# Patient Record
Sex: Female | Born: 1950
Health system: Southern US, Community
[De-identification: ages and names within clinical notes are randomized; demographics above are authoritative.]

## PROBLEM LIST (undated history)

## (undated) DIAGNOSIS — I10 Essential (primary) hypertension: Secondary | ICD-10-CM

---

## 2007-09-18 ENCOUNTER — Ambulatory Visit: Payer: Self-pay | Admitting: Orthopedic Surgery

## 2007-10-07 DIAGNOSIS — Z8679 Personal history of other diseases of the circulatory system: Secondary | ICD-10-CM | POA: Insufficient documentation

## 2007-10-07 DIAGNOSIS — I10 Essential (primary) hypertension: Secondary | ICD-10-CM | POA: Insufficient documentation

## 2007-10-09 ENCOUNTER — Ambulatory Visit: Payer: Self-pay | Admitting: Orthopedic Surgery

## 2007-10-09 DIAGNOSIS — M1711 Unilateral primary osteoarthritis, right knee: Secondary | ICD-10-CM | POA: Insufficient documentation

## 2007-10-09 DIAGNOSIS — M1710 Unilateral primary osteoarthritis, unspecified knee: Secondary | ICD-10-CM

## 2012-10-14 ENCOUNTER — Ambulatory Visit: Payer: Self-pay | Admitting: Urology

## 2012-12-04 ENCOUNTER — Ambulatory Visit (INDEPENDENT_AMBULATORY_CARE_PROVIDER_SITE_OTHER): Payer: Self-pay | Admitting: Otolaryngology

## 2013-01-08 ENCOUNTER — Ambulatory Visit (INDEPENDENT_AMBULATORY_CARE_PROVIDER_SITE_OTHER): Payer: Self-pay | Admitting: Otolaryngology

## 2013-09-02 ENCOUNTER — Encounter (INDEPENDENT_AMBULATORY_CARE_PROVIDER_SITE_OTHER): Payer: Self-pay | Admitting: *Deleted

## 2013-09-02 ENCOUNTER — Encounter (INDEPENDENT_AMBULATORY_CARE_PROVIDER_SITE_OTHER): Payer: Self-pay

## 2014-09-09 ENCOUNTER — Encounter (HOSPITAL_COMMUNITY): Payer: Self-pay | Admitting: Emergency Medicine

## 2014-09-09 ENCOUNTER — Emergency Department (HOSPITAL_COMMUNITY)
Admission: EM | Admit: 2014-09-09 | Discharge: 2014-09-09 | Disposition: A | Discharge status: Home / Self Care | Payer: BC Managed Care – PPO | Attending: Emergency Medicine | Admitting: Emergency Medicine

## 2014-09-09 DIAGNOSIS — R42 Dizziness and giddiness: Secondary | ICD-10-CM | POA: Insufficient documentation

## 2014-09-09 DIAGNOSIS — Z79899 Other long term (current) drug therapy: Secondary | ICD-10-CM | POA: Diagnosis not present

## 2014-09-09 DIAGNOSIS — F17200 Nicotine dependence, unspecified, uncomplicated: Secondary | ICD-10-CM | POA: Insufficient documentation

## 2014-09-09 DIAGNOSIS — I10 Essential (primary) hypertension: Secondary | ICD-10-CM | POA: Diagnosis not present

## 2014-09-09 DIAGNOSIS — Z7982 Long term (current) use of aspirin: Secondary | ICD-10-CM | POA: Insufficient documentation

## 2014-09-09 HISTORY — DX: Essential (primary) hypertension: I10

## 2014-09-09 LAB — BASIC METABOLIC PANEL
ANION GAP: 13 (ref 5–15)
BUN: 17 mg/dL (ref 6–23)
CHLORIDE: 101 meq/L (ref 96–112)
CO2: 26 mEq/L (ref 19–32)
Calcium: 9.2 mg/dL (ref 8.4–10.5)
Creatinine, Ser: 1.09 mg/dL (ref 0.50–1.10)
GFR, EST AFRICAN AMERICAN: 62 mL/min — AB (ref >90)
GFR, EST NON AFRICAN AMERICAN: 54 mL/min — AB (ref >90)
Glucose, Bld: 169 mg/dL — ABNORMAL HIGH (ref 70–99)
POTASSIUM: 3.4 meq/L — AB (ref 3.7–5.3)
SODIUM: 140 meq/L (ref 137–147)

## 2014-09-09 LAB — CBC
HEMATOCRIT: 39.5 % (ref 36.0–46.0)
HEMOGLOBIN: 13.5 g/dL (ref 12.0–15.0)
MCH: 31.8 pg (ref 26.0–34.0)
MCHC: 34.2 g/dL (ref 30.0–36.0)
MCV: 92.9 fL (ref 78.0–100.0)
PLATELETS: 213 10*3/uL (ref 150–400)
RBC: 4.25 MIL/uL (ref 3.87–5.11)
RDW: 14 % (ref 11.5–15.5)
WBC: 7.3 10*3/uL (ref 4.0–10.5)

## 2014-09-09 NOTE — ED Provider Notes (Signed)
CSN: [id number]     Arrival date & time 09/09/14  0109 History   First MD Initiated Contact with Patient 09/09/14 0111     Chief Complaint  Patient presents with  . Dizziness     HPI Patient presents the emergency department stating that when she awoke she felt strange and somewhat dizzy.  She feels better at this time.  She reports it is more of a "fogginess" that has come over her head.  She feels somewhat lightheaded as well.  She reports lightheadedness has resolved.  She reports that most of the "fogginess" has also resolved.  She denies weakness of her arms or legs.  She reports no recent nausea vomiting or diarrhea.  She reports her appetite has otherwise been good.  She states that her routine was somewhat abnormal today given house gas otherwise no significant changes.  Chills.  No urinary complaints.  Denies chest pain or neck pain.  Her abdominal pain.  No shortness of breath.  Symptoms are mild in severity quickly resolving on their own.  She's never had symptoms like this before.  Nothing worsens or improves her symptoms.    Past Medical History  Diagnosis Date  . Hypertension    History reviewed. No pertinent past surgical history. History reviewed. No pertinent family history. History  Substance Use Topics  . Smoking status: Current Every Day Smoker    Types: Cigarettes  . Smokeless tobacco: Not on file  . Alcohol Use: No   OB History   Grav Para Term Preterm Abortions TAB SAB Ect Mult Living                 Review of Systems  All other systems reviewed and are negative.     Allergies  Review of patient's allergies indicates not on file.  Home Medications   Prior to Admission medications   Medication Sig Start Date End Date Taking? Authorizing Provider  amLODipine (NORVASC) 5 MG tablet Take 5 mg by mouth daily.   Yes Historical Provider, MD  aspirin 81 MG tablet Take 81 mg by mouth daily.   Yes Historical Provider, MD  hydrochlorothiazide (HYDRODIURIL) 25  MG tablet Take 25 mg by mouth daily.   Yes Historical Provider, MD  metoprolol tartrate (LOPRESSOR) 25 MG tablet Take 25 mg by mouth 2 (two) times daily. Take 0.5 tablet daily   Yes Historical Provider, MD   BP 137/77  Pulse 78  Temp(Src) 98.9 F (37.2 C) (Oral)  Resp 20  Ht 5' (1.524 m)  SpO2 98% Physical Exam  Nursing note and vitals reviewed. Constitutional: She is oriented to person, place, and time. She appears well-developed and well-nourished. No distress.  HENT:  Head: Normocephalic and atraumatic.  Eyes: EOM are normal. Pupils are equal, round, and reactive to light.  Neck: Normal range of motion.  Cardiovascular: Normal rate, regular rhythm and normal heart sounds.   Pulmonary/Chest: Effort normal and breath sounds normal.  Abdominal: Soft. She exhibits no distension. There is no tenderness.  Musculoskeletal: Normal range of motion.  Neurological: She is alert and oriented to person, place, and time.  5/5 strength in major muscle groups of  bilateral upper and lower extremities. Speech normal. No facial asymetry.   Skin: Skin is warm and dry.  Psychiatric: She has a normal mood and affect. Judgment normal.    ED Course  Procedures (including critical care time) Labs Review Labs Reviewed  BASIC METABOLIC PANEL - Abnormal; Notable for the following:  Potassium 3.4 (*)    Glucose, Bld 169 (*)    GFR calc non Af Amer 54 (*)    GFR calc Af Amer 62 (*)    All other components within normal limits  CBC    Imaging Review No results found.   EKG Interpretation None      MDM   Final diagnoses:  Dizziness    Overall well-appearing.  The patient has been ambulatory.  Discharge home in good condition.  Nonfocal neuro exam.  No indication for imaging.  Unclear etiology of her symptoms but they have quickly resolved on their own.  She states she feels back to normal at this time.  Doubt stroke/TIA.  Doubt primary cardiac event.  No syncope.    Lyanne Co,  MD 09/09/14 [phone]

## 2014-09-09 NOTE — ED Notes (Signed)
Pt c/o feeling strange and dizzy when ems arrived. Pt states she feels better. Pt states she has seen police on her street and new neighbors that she don't know is causing stress.

## 2019-05-21 ENCOUNTER — Ambulatory Visit: Payer: Medicare Other | Admitting: Orthopaedic Surgery

## 2020-05-23 DIAGNOSIS — Z6831 Body mass index (BMI) 31.0-31.9, adult: Secondary | ICD-10-CM | POA: Diagnosis not present

## 2020-05-23 DIAGNOSIS — Z1331 Encounter for screening for depression: Secondary | ICD-10-CM | POA: Diagnosis not present

## 2020-05-23 DIAGNOSIS — Z1339 Encounter for screening examination for other mental health and behavioral disorders: Secondary | ICD-10-CM | POA: Diagnosis not present

## 2020-05-23 DIAGNOSIS — E559 Vitamin D deficiency, unspecified: Secondary | ICD-10-CM | POA: Diagnosis not present

## 2020-05-23 DIAGNOSIS — R5383 Other fatigue: Secondary | ICD-10-CM | POA: Diagnosis not present

## 2020-05-23 DIAGNOSIS — E7800 Pure hypercholesterolemia, unspecified: Secondary | ICD-10-CM | POA: Diagnosis not present

## 2020-05-23 DIAGNOSIS — M109 Gout, unspecified: Secondary | ICD-10-CM | POA: Diagnosis not present

## 2020-05-23 DIAGNOSIS — Z0000 Encounter for general adult medical examination without abnormal findings: Secondary | ICD-10-CM | POA: Diagnosis not present

## 2020-05-23 DIAGNOSIS — Z299 Encounter for prophylactic measures, unspecified: Secondary | ICD-10-CM | POA: Diagnosis not present

## 2020-05-23 DIAGNOSIS — Z1211 Encounter for screening for malignant neoplasm of colon: Secondary | ICD-10-CM | POA: Diagnosis not present

## 2020-05-23 DIAGNOSIS — Z79899 Other long term (current) drug therapy: Secondary | ICD-10-CM | POA: Diagnosis not present

## 2020-05-23 DIAGNOSIS — Z7189 Other specified counseling: Secondary | ICD-10-CM | POA: Diagnosis not present

## 2020-09-07 DIAGNOSIS — I10 Essential (primary) hypertension: Secondary | ICD-10-CM | POA: Diagnosis not present

## 2020-09-07 DIAGNOSIS — E1122 Type 2 diabetes mellitus with diabetic chronic kidney disease: Secondary | ICD-10-CM | POA: Diagnosis not present

## 2020-09-07 DIAGNOSIS — E1165 Type 2 diabetes mellitus with hyperglycemia: Secondary | ICD-10-CM | POA: Diagnosis not present

## 2020-09-07 DIAGNOSIS — Z299 Encounter for prophylactic measures, unspecified: Secondary | ICD-10-CM | POA: Diagnosis not present

## 2020-09-07 DIAGNOSIS — N183 Chronic kidney disease, stage 3 unspecified: Secondary | ICD-10-CM | POA: Diagnosis not present

## 2020-09-26 DIAGNOSIS — M1990 Unspecified osteoarthritis, unspecified site: Secondary | ICD-10-CM | POA: Diagnosis not present

## 2020-09-26 DIAGNOSIS — Z7982 Long term (current) use of aspirin: Secondary | ICD-10-CM | POA: Diagnosis not present

## 2020-09-26 DIAGNOSIS — E785 Hyperlipidemia, unspecified: Secondary | ICD-10-CM | POA: Diagnosis not present

## 2020-09-26 DIAGNOSIS — I10 Essential (primary) hypertension: Secondary | ICD-10-CM | POA: Diagnosis not present

## 2020-09-26 DIAGNOSIS — M109 Gout, unspecified: Secondary | ICD-10-CM | POA: Diagnosis not present

## 2020-09-26 DIAGNOSIS — Z8249 Family history of ischemic heart disease and other diseases of the circulatory system: Secondary | ICD-10-CM | POA: Diagnosis not present

## 2020-09-26 DIAGNOSIS — Z809 Family history of malignant neoplasm, unspecified: Secondary | ICD-10-CM | POA: Diagnosis not present

## 2020-09-26 DIAGNOSIS — Z833 Family history of diabetes mellitus: Secondary | ICD-10-CM | POA: Diagnosis not present

## 2020-09-26 DIAGNOSIS — Z791 Long term (current) use of non-steroidal anti-inflammatories (NSAID): Secondary | ICD-10-CM | POA: Diagnosis not present

## 2020-09-28 DIAGNOSIS — E1165 Type 2 diabetes mellitus with hyperglycemia: Secondary | ICD-10-CM | POA: Diagnosis not present

## 2020-09-28 DIAGNOSIS — I10 Essential (primary) hypertension: Secondary | ICD-10-CM | POA: Diagnosis not present

## 2020-09-28 DIAGNOSIS — E1122 Type 2 diabetes mellitus with diabetic chronic kidney disease: Secondary | ICD-10-CM | POA: Diagnosis not present

## 2020-09-28 DIAGNOSIS — Z299 Encounter for prophylactic measures, unspecified: Secondary | ICD-10-CM | POA: Diagnosis not present

## 2020-09-28 DIAGNOSIS — N183 Chronic kidney disease, stage 3 unspecified: Secondary | ICD-10-CM | POA: Diagnosis not present

## 2020-11-09 DIAGNOSIS — E7800 Pure hypercholesterolemia, unspecified: Secondary | ICD-10-CM | POA: Diagnosis not present

## 2020-11-09 DIAGNOSIS — Z23 Encounter for immunization: Secondary | ICD-10-CM | POA: Diagnosis not present

## 2020-11-09 DIAGNOSIS — I10 Essential (primary) hypertension: Secondary | ICD-10-CM | POA: Diagnosis not present

## 2020-11-09 DIAGNOSIS — Z6831 Body mass index (BMI) 31.0-31.9, adult: Secondary | ICD-10-CM | POA: Diagnosis not present

## 2020-11-09 DIAGNOSIS — N183 Chronic kidney disease, stage 3 unspecified: Secondary | ICD-10-CM | POA: Diagnosis not present

## 2020-11-09 DIAGNOSIS — R21 Rash and other nonspecific skin eruption: Secondary | ICD-10-CM | POA: Diagnosis not present

## 2020-11-09 DIAGNOSIS — Z299 Encounter for prophylactic measures, unspecified: Secondary | ICD-10-CM | POA: Diagnosis not present

## 2020-12-09 DIAGNOSIS — I10 Essential (primary) hypertension: Secondary | ICD-10-CM | POA: Diagnosis not present

## 2020-12-09 DIAGNOSIS — R42 Dizziness and giddiness: Secondary | ICD-10-CM | POA: Diagnosis not present

## 2020-12-09 DIAGNOSIS — N183 Chronic kidney disease, stage 3 unspecified: Secondary | ICD-10-CM | POA: Diagnosis not present

## 2020-12-09 DIAGNOSIS — E1122 Type 2 diabetes mellitus with diabetic chronic kidney disease: Secondary | ICD-10-CM | POA: Diagnosis not present

## 2020-12-09 DIAGNOSIS — Z299 Encounter for prophylactic measures, unspecified: Secondary | ICD-10-CM | POA: Diagnosis not present

## 2020-12-09 DIAGNOSIS — E1165 Type 2 diabetes mellitus with hyperglycemia: Secondary | ICD-10-CM | POA: Diagnosis not present

## 2021-03-13 DIAGNOSIS — M109 Gout, unspecified: Secondary | ICD-10-CM | POA: Diagnosis not present

## 2021-03-13 DIAGNOSIS — E1165 Type 2 diabetes mellitus with hyperglycemia: Secondary | ICD-10-CM | POA: Diagnosis not present

## 2021-03-13 DIAGNOSIS — N183 Chronic kidney disease, stage 3 unspecified: Secondary | ICD-10-CM | POA: Diagnosis not present

## 2021-03-13 DIAGNOSIS — Z87891 Personal history of nicotine dependence: Secondary | ICD-10-CM | POA: Diagnosis not present

## 2021-03-13 DIAGNOSIS — E1122 Type 2 diabetes mellitus with diabetic chronic kidney disease: Secondary | ICD-10-CM | POA: Diagnosis not present

## 2021-03-13 DIAGNOSIS — Z299 Encounter for prophylactic measures, unspecified: Secondary | ICD-10-CM | POA: Diagnosis not present

## 2021-03-13 DIAGNOSIS — Z6832 Body mass index (BMI) 32.0-32.9, adult: Secondary | ICD-10-CM | POA: Diagnosis not present

## 2021-03-13 DIAGNOSIS — I10 Essential (primary) hypertension: Secondary | ICD-10-CM | POA: Diagnosis not present

## 2021-03-21 DIAGNOSIS — H25813 Combined forms of age-related cataract, bilateral: Secondary | ICD-10-CM | POA: Diagnosis not present

## 2021-03-21 DIAGNOSIS — H5200 Hypermetropia, unspecified eye: Secondary | ICD-10-CM | POA: Diagnosis not present

## 2021-03-21 DIAGNOSIS — E119 Type 2 diabetes mellitus without complications: Secondary | ICD-10-CM | POA: Diagnosis not present

## 2021-05-24 DIAGNOSIS — Z7189 Other specified counseling: Secondary | ICD-10-CM | POA: Diagnosis not present

## 2021-05-24 DIAGNOSIS — E669 Obesity, unspecified: Secondary | ICD-10-CM | POA: Diagnosis not present

## 2021-05-24 DIAGNOSIS — Z1339 Encounter for screening examination for other mental health and behavioral disorders: Secondary | ICD-10-CM | POA: Diagnosis not present

## 2021-05-24 DIAGNOSIS — E1165 Type 2 diabetes mellitus with hyperglycemia: Secondary | ICD-10-CM | POA: Diagnosis not present

## 2021-05-24 DIAGNOSIS — Z299 Encounter for prophylactic measures, unspecified: Secondary | ICD-10-CM | POA: Diagnosis not present

## 2021-05-24 DIAGNOSIS — E7800 Pure hypercholesterolemia, unspecified: Secondary | ICD-10-CM | POA: Diagnosis not present

## 2021-05-24 DIAGNOSIS — Z1331 Encounter for screening for depression: Secondary | ICD-10-CM | POA: Diagnosis not present

## 2021-05-24 DIAGNOSIS — Z6834 Body mass index (BMI) 34.0-34.9, adult: Secondary | ICD-10-CM | POA: Diagnosis not present

## 2021-05-24 DIAGNOSIS — Z0000 Encounter for general adult medical examination without abnormal findings: Secondary | ICD-10-CM | POA: Diagnosis not present

## 2021-05-25 DIAGNOSIS — E559 Vitamin D deficiency, unspecified: Secondary | ICD-10-CM | POA: Diagnosis not present

## 2021-05-25 DIAGNOSIS — Z79899 Other long term (current) drug therapy: Secondary | ICD-10-CM | POA: Diagnosis not present

## 2021-05-25 DIAGNOSIS — E7800 Pure hypercholesterolemia, unspecified: Secondary | ICD-10-CM | POA: Diagnosis not present

## 2021-05-25 DIAGNOSIS — M109 Gout, unspecified: Secondary | ICD-10-CM | POA: Diagnosis not present

## 2021-05-25 DIAGNOSIS — R5383 Other fatigue: Secondary | ICD-10-CM | POA: Diagnosis not present

## 2021-05-30 DIAGNOSIS — H25813 Combined forms of age-related cataract, bilateral: Secondary | ICD-10-CM | POA: Diagnosis not present

## 2021-05-30 DIAGNOSIS — H01002 Unspecified blepharitis right lower eyelid: Secondary | ICD-10-CM | POA: Diagnosis not present

## 2021-05-30 DIAGNOSIS — H01001 Unspecified blepharitis right upper eyelid: Secondary | ICD-10-CM | POA: Diagnosis not present

## 2021-05-30 DIAGNOSIS — H01004 Unspecified blepharitis left upper eyelid: Secondary | ICD-10-CM | POA: Diagnosis not present

## 2021-06-29 ENCOUNTER — Other Ambulatory Visit: Payer: Self-pay | Admitting: Internal Medicine

## 2021-06-29 DIAGNOSIS — Z139 Encounter for screening, unspecified: Secondary | ICD-10-CM

## 2021-06-30 ENCOUNTER — Other Ambulatory Visit: Payer: Self-pay

## 2021-06-30 ENCOUNTER — Ambulatory Visit
Admission: RE | Admit: 2021-06-30 | Discharge: 2021-06-30 | Disposition: A | Discharge status: Home / Self Care | Payer: Medicare PPO | Source: Ambulatory Visit | Attending: Internal Medicine | Admitting: Internal Medicine

## 2021-06-30 DIAGNOSIS — Z139 Encounter for screening, unspecified: Secondary | ICD-10-CM

## 2021-06-30 DIAGNOSIS — Z1231 Encounter for screening mammogram for malignant neoplasm of breast: Secondary | ICD-10-CM | POA: Diagnosis not present

## 2021-06-30 LAB — HM MAMMOGRAPHY

## 2021-07-10 DIAGNOSIS — E2839 Other primary ovarian failure: Secondary | ICD-10-CM | POA: Diagnosis not present

## 2021-07-10 LAB — HM DEXA SCAN

## 2021-07-17 DIAGNOSIS — H25811 Combined forms of age-related cataract, right eye: Secondary | ICD-10-CM | POA: Diagnosis not present

## 2021-07-18 ENCOUNTER — Encounter (HOSPITAL_COMMUNITY)
Admission: RE | Admit: 2021-07-18 | Discharge: 2021-07-18 | Disposition: A | Discharge status: Home / Self Care | Payer: Medicare PPO | Source: Ambulatory Visit | Attending: Ophthalmology | Admitting: Ophthalmology

## 2021-07-18 ENCOUNTER — Other Ambulatory Visit: Payer: Self-pay

## 2021-07-18 NOTE — H&P (Signed)
Surgical History & Physical  Patient Name: Carla Webb DOB: [birth year]  Surgery: Cataract extraction with intraocular lens implant phacoemulsification; Right Eye  Surgeon: Fabio Pierce MD Surgery Date:  07/24/2021 Pre-Op Date:  07/18/2021  HPI: A 31 Yr. old female patient Pt referred by Dr. Daphine Deutscher for cataract evaluation. The patient complains of difficulty when driving due to glare from headlights or sun, which began 1-2 years ago. Both eyes are affected. The condition's severity is worsening. The condition is worse night. The complaint is associated with blurry vision, glare and halos. Pt states she has d/c night driving due to glare and poor vision in dimmer settings. Symptoms are negatively affecting pt's quality of life. Pt does not use any eye drops. Pt denies any increase in floaters/flashes of light. Pt does not check BS daily/weekly A1C%-6.2 two weeks ago HPI Completed by Dr. Fabio Pierce  Medical History: Cataracts Floaters, Asteroid Hyalosis Diabetes High Blood Pressure LDL  Review of Systems Negative Allergic/Immunologic Negative Cardiovascular Negative Constitutional Negative Ear, Nose, Mouth & Throat Negative Endocrine Negative Eyes Negative Gastrointestinal Negative Genitourinary Negative Hemotologic/Lymphatic Negative Integumentary Negative Musculoskeletal Negative Neurological Negative Psychiatry Negative Respiratory  Social   Current some day smoker of Cigarettes   Medication Allopurinol, Amlodipine besylate, Hydrochlorothiazide, Metoprolol, Aspirin, Aspirin,   Sx/Procedures  None  Drug Allergies   NKDA  History & Physical: Heent: Cataract, Right eye NECK: supple without bruits LUNGS: lungs clear to auscultation CV: regular rate and rhythm Abdomen: soft and non-tender  Impression & Plan: Assessment: 1.  COMBINED FORMS AGE RELATED CATARACT; Both Eyes (H25.813) 2.  BLEPHARITIS; Right Upper Lid, Right Lower Lid, Left Upper Lid, Left Lower  Lid (H01.001, H01.002,H01.004,H01.005) 3.  ASTEROID HYALOSIS; Both Eyes (H43.23) 4.  ASTIGMATISM, REGULAR; Both Eyes (H52.223)  Plan: 1.  Cataract accounts for the patient's decreased vision. This visual impairment is not correctable with a tolerable change in glasses or contact lenses. Cataract surgery with an implantation of a new lens should significantly improve the visual and functional status of the patient. Discussed all risks, benefits, alternatives, and potential complications. Discussed the procedures and recovery. Patient desires to have surgery. A-scan ordered and performed today for intra-ocular lens calculations. The surgery will be performed in order to improve vision for driving, reading, and for eye examinations. Recommend phacoemulsification with intra-ocular lens. Recommend Dextenza for post-operative pain and inflammation. RIght Eye Worse - First. Dilates poorly - shugacaine by protocol. Malyugin Ring. Toric Lens.  2.  Recommend regular lid cleaning.  3.  Asteroid hyalosis is a benign degenerative eye condition marked by a buildup of calcium and lipids (fats) in the fluid between your retina and lens, called the vitreous humor. It may cause some floaters.  4.  Recommend toric lens OU.

## 2021-07-24 ENCOUNTER — Encounter (HOSPITAL_COMMUNITY)
Admission: RE | Disposition: A | Discharge status: Home / Self Care | Payer: Self-pay | Source: Home / Self Care | Attending: Ophthalmology

## 2021-07-24 ENCOUNTER — Ambulatory Visit (HOSPITAL_COMMUNITY): Payer: Medicare PPO | Admitting: Anesthesiology

## 2021-07-24 ENCOUNTER — Other Ambulatory Visit: Payer: Self-pay

## 2021-07-24 ENCOUNTER — Encounter (HOSPITAL_COMMUNITY): Payer: Self-pay | Admitting: Ophthalmology

## 2021-07-24 ENCOUNTER — Ambulatory Visit (HOSPITAL_COMMUNITY)
Admission: RE | Admit: 2021-07-24 | Discharge: 2021-07-24 | Disposition: A | Discharge status: Home / Self Care | Payer: Medicare PPO | Attending: Ophthalmology | Admitting: Ophthalmology

## 2021-07-24 DIAGNOSIS — H0100A Unspecified blepharitis right eye, upper and lower eyelids: Secondary | ICD-10-CM | POA: Insufficient documentation

## 2021-07-24 DIAGNOSIS — Z79899 Other long term (current) drug therapy: Secondary | ICD-10-CM | POA: Diagnosis not present

## 2021-07-24 DIAGNOSIS — H25813 Combined forms of age-related cataract, bilateral: Secondary | ICD-10-CM | POA: Insufficient documentation

## 2021-07-24 DIAGNOSIS — E1136 Type 2 diabetes mellitus with diabetic cataract: Secondary | ICD-10-CM | POA: Diagnosis not present

## 2021-07-24 DIAGNOSIS — H2181 Floppy iris syndrome: Secondary | ICD-10-CM | POA: Diagnosis not present

## 2021-07-24 DIAGNOSIS — H4323 Crystalline deposits in vitreous body, bilateral: Secondary | ICD-10-CM | POA: Diagnosis not present

## 2021-07-24 DIAGNOSIS — H0100B Unspecified blepharitis left eye, upper and lower eyelids: Secondary | ICD-10-CM | POA: Insufficient documentation

## 2021-07-24 DIAGNOSIS — F17210 Nicotine dependence, cigarettes, uncomplicated: Secondary | ICD-10-CM | POA: Diagnosis not present

## 2021-07-24 DIAGNOSIS — H52223 Regular astigmatism, bilateral: Secondary | ICD-10-CM | POA: Diagnosis not present

## 2021-07-24 DIAGNOSIS — H25811 Combined forms of age-related cataract, right eye: Secondary | ICD-10-CM | POA: Diagnosis not present

## 2021-07-24 HISTORY — PX: CATARACT EXTRACTION W/PHACO: SHX586

## 2021-07-24 SURGERY — PHACOEMULSIFICATION, CATARACT, WITH IOL INSERTION
Anesthesia: Monitor Anesthesia Care | Laterality: Right

## 2021-07-24 MED ORDER — PHENYLEPHRINE HCL 2.5 % OP SOLN
1.0000 [drp] | OPHTHALMIC | Status: AC | PRN
  Administered 2021-07-24 (×3): 1 [drp] via OPHTHALMIC

## 2021-07-24 MED ORDER — NEOMYCIN-POLYMYXIN-DEXAMETH 3.5-10000-0.1 OP SUSP
OPHTHALMIC | Status: DC | PRN
  Administered 2021-07-24: 1 [drp] via OPHTHALMIC

## 2021-07-24 MED ORDER — MIDAZOLAM HCL 2 MG/2ML IJ SOLN
INTRAMUSCULAR | Status: DC | PRN
  Administered 2021-07-24: 1 mg via INTRAVENOUS

## 2021-07-24 MED ORDER — STERILE WATER FOR IRRIGATION IR SOLN
Status: DC | PRN
  Administered 2021-07-24: 250 mL

## 2021-07-24 MED ORDER — PHENYLEPHRINE-KETOROLAC 1-0.3 % IO SOLN
INTRAOCULAR | Status: AC
  Filled 2021-07-24: qty 4

## 2021-07-24 MED ORDER — TROPICAMIDE 1 % OP SOLN
1.0000 [drp] | OPHTHALMIC | Status: AC
  Administered 2021-07-24 (×3): 1 [drp] via OPHTHALMIC

## 2021-07-24 MED ORDER — POVIDONE-IODINE 5 % OP SOLN
OPHTHALMIC | Status: DC | PRN
  Administered 2021-07-24: 1 via OPHTHALMIC

## 2021-07-24 MED ORDER — SODIUM CHLORIDE 0.9% FLUSH
INTRAVENOUS | Status: DC | PRN
  Administered 2021-07-24: 5 mL via INTRAVENOUS

## 2021-07-24 MED ORDER — LIDOCAINE HCL (PF) 1 % IJ SOLN
INTRAOCULAR | Status: DC | PRN
  Administered 2021-07-24: 1 mL via OPHTHALMIC

## 2021-07-24 MED ORDER — SODIUM HYALURONATE 23MG/ML IO SOSY
PREFILLED_SYRINGE | INTRAOCULAR | Status: DC | PRN
  Administered 2021-07-24: 0.6 mL via INTRAOCULAR

## 2021-07-24 MED ORDER — MIDAZOLAM HCL 2 MG/2ML IJ SOLN
INTRAMUSCULAR | Status: AC
  Filled 2021-07-24: qty 2

## 2021-07-24 MED ORDER — EPINEPHRINE PF 1 MG/ML IJ SOLN
INTRAMUSCULAR | Status: AC
  Filled 2021-07-24: qty 2

## 2021-07-24 MED ORDER — BSS IO SOLN
INTRAOCULAR | Status: DC | PRN
  Administered 2021-07-24: 15 mL via INTRAOCULAR

## 2021-07-24 MED ORDER — SODIUM HYALURONATE 10 MG/ML IO SOLUTION
PREFILLED_SYRINGE | INTRAOCULAR | Status: DC | PRN
  Administered 2021-07-24: 0.85 mL via INTRAOCULAR

## 2021-07-24 MED ORDER — TETRACAINE HCL 0.5 % OP SOLN
1.0000 [drp] | OPHTHALMIC | Status: AC | PRN
  Administered 2021-07-24 (×3): 1 [drp] via OPHTHALMIC

## 2021-07-24 MED ORDER — PHENYLEPHRINE-KETOROLAC 1-0.3 % IO SOLN
INTRAOCULAR | Status: DC | PRN
  Administered 2021-07-24: 500 mL via OPHTHALMIC

## 2021-07-24 MED ORDER — LIDOCAINE HCL 3.5 % OP GEL
1.0000 "application " | Freq: Once | OPHTHALMIC | Status: AC
  Administered 2021-07-24: 1 via OPHTHALMIC

## 2021-07-24 SURGICAL SUPPLY — 13 items
CLOTH BEACON ORANGE TIMEOUT ST (SAFETY) ×2 IMPLANT
EYE SHIELD UNIVERSAL CLEAR (GAUZE/BANDAGES/DRESSINGS) ×2 IMPLANT
GLOVE SURG UNDER POLY LF SZ7 (GLOVE) ×4 IMPLANT
NEEDLE HYPO 18GX1.5 BLUNT FILL (NEEDLE) ×2 IMPLANT
PAD ARMBOARD 7.5X6 YLW CONV (MISCELLANEOUS) ×2 IMPLANT
PROC W SPEC LENS (INTRAOCULAR LENS)
PROCESS W SPEC LENS (INTRAOCULAR LENS) IMPLANT
RING MALYGIN 7.0 (MISCELLANEOUS) IMPLANT
SYR TB 1ML LL NO SAFETY (SYRINGE) ×2 IMPLANT
TAPE SURG TRANSPORE 1 IN (GAUZE/BANDAGES/DRESSINGS) ×1 IMPLANT
TAPE SURGICAL TRANSPORE 1 IN (GAUZE/BANDAGES/DRESSINGS) ×1
TECHNIS EYHANCE TORIC II IOL (Intraocular Lens) ×2 IMPLANT
WATER STERILE IRR 250ML POUR (IV SOLUTION) ×2 IMPLANT

## 2021-07-24 NOTE — Op Note (Signed)
Date of procedure: 07/24/21  Pre-operative diagnosis: Visually significant combined-form cataract, Right Eye; Poor Dilation, Right Eye (H25.811; H21.81) Visually significant regular astigmatism, right eye  Post-operative diagnosis: Visually significant cataract, Right Eye; Intra-operative Floppy Iris Syndrome, Right Eye  Procedure: Removal of cataract via phacoemulsification and insertion of intra-ocular lens Johnson and Bejou  +21.5D into the capsular bag of the Right Eye (CPT [phone])  Attending surgeon: Gerda Diss. Ivry Pigue, MD, MA  Anesthesia: MAC, Topical Akten  Complications: None  Estimated Blood Loss: <3m (minimal)  Specimens: None  Implants: As above  Indications:  Visually significant cataract, Right Eye  Procedure:  The patient was seen and identified in the pre-operative area. The operative eye was identified and dilated.  The operative eye was marked.  Topical anesthesia was administered to the operative eye.     The patient was then to the operative suite and placed in the supine position.  A timeout was performed confirming the patient, procedure to be performed, and all other relevant information.   The patient's face was prepped and draped in the usual fashion for intra-ocular surgery.  A lid speculum was placed into the operative eye and the surgical microscope moved into place and focused.  Poor dilation of the iris was confirmed.  A superotemporal paracentesis was created using a 20 gauge paracentesis blade.  Shugarcaine was injected into the anterior chamber.  Viscoelastic was injected into the anterior chamber.  A temporal clear-corneal main wound incision was created using a 2.4100mmicrokeratome.  A Malyugin ring was placed.  A continuous curvilinear capsulorrhexis was initiated using an irrigating cystitome and completed using capsulorrhexis forceps.  Hydrodissection and hydrodeliniation were performed.  Viscoelastic was injected into the anterior chamber.   A phacoemulsification handpiece and a chopper as a second instrument were used to remove the nucleus and epinucleus. The irrigation/aspiration handpiece was used to remove any remaining cortical material.   The capsular bag was reinflated with viscoelastic, checked, and found to be intact.  The eye was marked at 124 degrees.  The intraocular lens was inserted into the capsular bag and dialed into place using a MaSurveyor, minerals The Malyugin ring was removed.  The irrigation/aspiration handpiece was used to remove any remaining viscoelastic.  The clear corneal wound and paracentesis wounds were then hydrated and checked with Weck-Cels to be watertight.  The lid-speculum and drape was removed, and the patient's face was cleaned with a wet and dry 4x4.  Maxitrol was instilled in the eye before a clear shield was taped over the eye. The patient was taken to the post-operative care unit in good condition, having tolerated the procedure well.  Post-Op Instructions: [facility] and will receive all other orders and instructions.

## 2021-07-24 NOTE — Anesthesia Preprocedure Evaluation (Signed)
Anesthesia Evaluation  Patient identified by MRN, date of birth, ID band Patient awake    Reviewed: Allergy & Precautions, NPO status , Patient's Chart, lab work & pertinent test results, reviewed documented beta blocker date and time   History of Anesthesia Complications Negative for: history of anesthetic complications  Airway Mallampati: II  TM Distance: >3 FB Neck ROM: Full    Dental  (+) Dental Advisory Given, Caps   Pulmonary neg pulmonary ROS, Current Smoker and Patient abstained from smoking.,    Pulmonary exam normal breath sounds clear to auscultation       Cardiovascular Exercise Tolerance: Good hypertension, Pt. on medications and Pt. on home beta blockers Normal cardiovascular exam Rhythm:Regular Rate:Normal     Neuro/Psych negative neurological ROS  negative psych ROS   GI/Hepatic negative GI ROS, Neg liver ROS,   Endo/Other  negative endocrine ROS  Renal/GU negative Renal ROS     Musculoskeletal  (+) Arthritis ,   Abdominal   Peds  Hematology negative hematology ROS (+)   Anesthesia Other Findings   Reproductive/Obstetrics                             Anesthesia Physical Anesthesia Plan  ASA: 2  Anesthesia Plan: MAC   Post-op Pain Management:    Induction:   PONV Risk Score and Plan:   Airway Management Planned: Nasal Cannula and Natural Airway  Additional Equipment:   Intra-op Plan:   Post-operative Plan:   Informed Consent: I have reviewed the patients History and Physical, chart, labs and discussed the procedure including the risks, benefits and alternatives for the proposed anesthesia with the patient or authorized representative who has indicated his/her understanding and acceptance.     Dental advisory given  Plan Discussed with: CRNA and Surgeon  Anesthesia Plan Comments:         Anesthesia Quick Evaluation

## 2021-07-24 NOTE — Transfer of Care (Signed)
Immediate Anesthesia Transfer of Care Note  Patient: [name]  Procedure(s) Performed: CATARACT EXTRACTION PHACO AND INTRAOCULAR LENS PLACEMENT (IOC) (Right)  Patient Location: Short Stay  Anesthesia Type:MAC  Level of Consciousness: awake, alert  and oriented  Airway & Oxygen Therapy: Patient Spontanous Breathing  Post-op Assessment: Report given to RN, Post -op Vital signs reviewed and stable and Patient moving all extremities X 4  Post vital signs: Reviewed and stable  Last Vitals:  Vitals Value Taken Time  BP 123/68 07/24/21 1002  Temp 36.7 C 07/24/21 1002  Pulse 53 07/24/21 1002  Resp 16 07/24/21 1002  SpO2 100 % 07/24/21 1002    Last Pain:  Vitals:   07/24/21 1002  TempSrc: Axillary  PainSc: 0-No pain         Complications: No notable events documented.

## 2021-07-24 NOTE — Interval H&P Note (Signed)
History and Physical Interval Note:  07/24/2021 9:33 AM  [facility]  has presented today for surgery, with the diagnosis of Nuclear sclerotic cataract - Right eye.  The various methods of treatment have been discussed with the patient and family. After consideration of risks, benefits and other options for treatment, the patient has consented to  Procedure(s) with comments: CATARACT EXTRACTION PHACO AND INTRAOCULAR LENS PLACEMENT (IOC) (Right) - right as a surgical intervention.  The patient's history has been reviewed, patient examined, no change in status, stable for surgery.  I have reviewed the patient's chart and labs.  Questions were answered to the patient's satisfaction.     Fabio Pierce

## 2021-07-24 NOTE — Anesthesia Postprocedure Evaluation (Signed)
Anesthesia Post Note  Patient: [facility]  Procedure(s) Performed: CATARACT EXTRACTION PHACO AND INTRAOCULAR LENS PLACEMENT (IOC) (Right)  Patient location during evaluation: Phase II Anesthesia Type: MAC Level of consciousness: awake and alert and oriented Pain management: pain level controlled Vital Signs Assessment: post-procedure vital signs reviewed and stable Respiratory status: spontaneous breathing and respiratory function stable Cardiovascular status: blood pressure returned to baseline and stable Postop Assessment: no apparent nausea or vomiting Anesthetic complications: no   No notable events documented.   Last Vitals:  Vitals:   07/24/21 0840 07/24/21 1002  BP: (!) 144/63 123/68  Pulse: (!) 57 (!) 53  Resp: 16 16  Temp: 36.7 C 36.7 C  SpO2: 100% 100%    Last Pain:  Vitals:   07/24/21 1002  TempSrc: Axillary  PainSc: 0-No pain                 Corisa Montini C Maili Shutters

## 2021-07-24 NOTE — Anesthesia Procedure Notes (Signed)
Procedure Name: MAC Date/Time: 07/24/2021 9:40 AM Performed by: Orlie Dakin, CRNA Pre-anesthesia Checklist: Emergency Drugs available, Patient identified, Suction available and Patient being monitored Patient Re-evaluated:Patient Re-evaluated prior to induction Oxygen Delivery Method: Nasal cannula Placement Confirmation: positive ETCO2

## 2021-07-24 NOTE — Discharge Instructions (Signed)
Please discharge patient when stable, will follow up today with Dr. Cordarrius Coad at the [facility] [place] office immediately following discharge.  Leave shield in place until visit.  All paperwork with discharge instructions will be given at the office.  [facility] Address:  [address]  [place], [place] [address]  

## 2021-07-25 ENCOUNTER — Encounter (HOSPITAL_COMMUNITY): Payer: Self-pay | Admitting: Ophthalmology

## 2021-07-31 DIAGNOSIS — H25812 Combined forms of age-related cataract, left eye: Secondary | ICD-10-CM | POA: Diagnosis not present

## 2021-08-01 NOTE — H&P (Signed)
Surgical History & Physical  Patient Name: Carla Webb DOB: 13-May-1950  Surgery: Cataract extraction with intraocular lens implant phacoemulsification; Left Eye  Surgeon: Fabio Pierce MD Surgery Date:  08/07/2021 Pre-Op Date:  07/31/2021  HPI: A 68 Yr. old female patient The patient is returning after cataract surgery. The right eye is affected. Status post cataract surgery, which began 1 week ago: Since the last visit, the affected area is doing well. The patient's vision is improved and stable. Patient is following medication instructions. Pt taking combo drop TID OD. Pt denies any increase in floaters/flashes of light. The patient complains of difficulty when driving due to glare from headlights or sun, which began 1-2 years ago. The left eye is affected. The condition's severity is worsening. The condition is worse night. The complaint is associated with blurry vision, glare and halos. Pt states she has d/c night driving due to glare and poor vision in dimmer settings. Symptoms are negatively affecting pt's quality of life. HPI Completed by Dr. Fabio Pierce  Medical History: Cataracts Floaters, Asteroid Hyalosis Diabetes High Blood Pressure LDL  Review of Systems Negative Allergic/Immunologic Negative Cardiovascular Negative Constitutional Negative Ear, Nose, Mouth & Throat Negative Endocrine Negative Eyes Negative Gastrointestinal Negative Genitourinary Negative Hemotologic/Lymphatic Negative Integumentary Negative Musculoskeletal Negative Neurological Negative Psychiatry Negative Respiratory  Social   Current some day smoker of Cigarettes   Medication Prednisolone-Moxifloxacin-Bromfenac,  Allopurinol, Amlodipine besylate, Hydrochlorothiazide, Metoprolol, Aspirin, Aspirin,   Sx/Procedures Phaco c IOL OD-Toric,   Drug Allergies   NKDA  History & Physical: Heent:  Cataract, Left eye NECK: supple without bruits LUNGS: lungs clear to auscultation CV:  regular rate and rhythm Abdomen: soft and non-tender  Impression & Plan: Assessment: 1.  COMBINED FORMS AGE RELATED CATARACT; Left Eye (H25.812) 2.  CATARACT EXTRACTION STATUS; Right Eye (Z98.41) 3.  INTRAOCULAR LENS IOL (Z96.1) 4.  Presbyopia ; Both Eyes (H52.4) 5.  NUCLEAR SCLEROSIS AGE RELATED; Left Eye (H25.12) 6.  ASTIGMATISM, REGULAR; Both Eyes (H52.223)  Plan: 1.  Left Eye. Dilates poorly - shugacaine by protocol. Malyugin Ring. Omidira. Toric Lens. Cataract accounts for the patient's decreased vision. This visual impairment is not correctable with a tolerable change in glasses or contact lenses. Cataract surgery with an implantation of a new lens should significantly improve the visual and functional status of the patient. Discussed all risks, benefits, alternatives, and potential complications. Discussed the procedures and recovery. Patient desires to have surgery. A-scan ordered and performed today for intra-ocular lens calculations. The surgery will be performed in order to improve vision for driving, reading, and for eye examinations. Recommend phacoemulsification with intra-ocular lens. Recommend Dextenza for post-operative pain and inflammation. Surgery required to correct imbalance of vision.  2.  1 week after cataract surgery. Doing well with improved vision and normal eye pressure. Call with any problems or concerns. Continue Pred-Moxi-Brom 2x/day for 3 more weeks.  3.  Doing well since surgery Continue Post-op medications  4.   5.   6.  Recommend Toric IOL OU.

## 2021-08-03 ENCOUNTER — Encounter (HOSPITAL_COMMUNITY): Payer: Self-pay

## 2021-08-03 ENCOUNTER — Encounter (HOSPITAL_COMMUNITY)
Admission: RE | Admit: 2021-08-03 | Discharge: 2021-08-03 | Disposition: A | Discharge status: Home / Self Care | Payer: Medicare PPO | Source: Ambulatory Visit | Attending: Ophthalmology | Admitting: Ophthalmology

## 2021-08-03 ENCOUNTER — Other Ambulatory Visit: Payer: Self-pay

## 2021-08-07 ENCOUNTER — Other Ambulatory Visit: Payer: Self-pay

## 2021-08-07 ENCOUNTER — Encounter (HOSPITAL_COMMUNITY)
Admission: RE | Disposition: A | Discharge status: Home / Self Care | Payer: Self-pay | Source: Home / Self Care | Attending: Ophthalmology

## 2021-08-07 ENCOUNTER — Encounter (HOSPITAL_COMMUNITY): Payer: Self-pay | Admitting: Ophthalmology

## 2021-08-07 ENCOUNTER — Ambulatory Visit (HOSPITAL_COMMUNITY)
Admission: RE | Admit: 2021-08-07 | Discharge: 2021-08-07 | Disposition: A | Discharge status: Home / Self Care | Payer: Medicare PPO | Attending: Ophthalmology | Admitting: Ophthalmology

## 2021-08-07 ENCOUNTER — Ambulatory Visit (HOSPITAL_COMMUNITY): Payer: Medicare PPO | Admitting: Certified Registered"

## 2021-08-07 DIAGNOSIS — F17210 Nicotine dependence, cigarettes, uncomplicated: Secondary | ICD-10-CM | POA: Insufficient documentation

## 2021-08-07 DIAGNOSIS — H2181 Floppy iris syndrome: Secondary | ICD-10-CM | POA: Insufficient documentation

## 2021-08-07 DIAGNOSIS — Z9841 Cataract extraction status, right eye: Secondary | ICD-10-CM | POA: Insufficient documentation

## 2021-08-07 DIAGNOSIS — H52202 Unspecified astigmatism, left eye: Secondary | ICD-10-CM | POA: Diagnosis not present

## 2021-08-07 DIAGNOSIS — E1136 Type 2 diabetes mellitus with diabetic cataract: Secondary | ICD-10-CM | POA: Diagnosis not present

## 2021-08-07 DIAGNOSIS — F17200 Nicotine dependence, unspecified, uncomplicated: Secondary | ICD-10-CM | POA: Diagnosis not present

## 2021-08-07 DIAGNOSIS — Z961 Presence of intraocular lens: Secondary | ICD-10-CM | POA: Insufficient documentation

## 2021-08-07 DIAGNOSIS — H524 Presbyopia: Secondary | ICD-10-CM | POA: Insufficient documentation

## 2021-08-07 DIAGNOSIS — H25812 Combined forms of age-related cataract, left eye: Secondary | ICD-10-CM | POA: Diagnosis not present

## 2021-08-07 HISTORY — PX: CATARACT EXTRACTION W/PHACO: SHX586

## 2021-08-07 SURGERY — PHACOEMULSIFICATION, CATARACT, WITH IOL INSERTION
Anesthesia: Monitor Anesthesia Care | Site: Eye | Laterality: Left

## 2021-08-07 MED ORDER — POVIDONE-IODINE 5 % OP SOLN
OPHTHALMIC | Status: DC | PRN
  Administered 2021-08-07: 1 via OPHTHALMIC

## 2021-08-07 MED ORDER — STERILE WATER FOR IRRIGATION IR SOLN
Status: DC | PRN
  Administered 2021-08-07: 250 mL

## 2021-08-07 MED ORDER — TROPICAMIDE 1 % OP SOLN
1.0000 [drp] | OPHTHALMIC | Status: AC
  Administered 2021-08-07 (×3): 1 [drp] via OPHTHALMIC

## 2021-08-07 MED ORDER — BSS IO SOLN
INTRAOCULAR | Status: DC | PRN
  Administered 2021-08-07: 15 mL via INTRAOCULAR

## 2021-08-07 MED ORDER — SODIUM HYALURONATE 23MG/ML IO SOSY
PREFILLED_SYRINGE | INTRAOCULAR | Status: DC | PRN
  Administered 2021-08-07: 0.6 mL via INTRAOCULAR

## 2021-08-07 MED ORDER — LIDOCAINE HCL (PF) 1 % IJ SOLN
INTRAOCULAR | Status: DC | PRN
  Administered 2021-08-07: 1 mL via OPHTHALMIC

## 2021-08-07 MED ORDER — SODIUM HYALURONATE 10 MG/ML IO SOLUTION
PREFILLED_SYRINGE | INTRAOCULAR | Status: DC | PRN
  Administered 2021-08-07: 0.85 mL via INTRAOCULAR

## 2021-08-07 MED ORDER — TETRACAINE HCL 0.5 % OP SOLN
1.0000 [drp] | OPHTHALMIC | Status: AC | PRN
  Administered 2021-08-07 (×3): 1 [drp] via OPHTHALMIC

## 2021-08-07 MED ORDER — LIDOCAINE HCL 3.5 % OP GEL
1.0000 "application " | Freq: Once | OPHTHALMIC | Status: AC
  Administered 2021-08-07: 1 via OPHTHALMIC

## 2021-08-07 MED ORDER — NEOMYCIN-POLYMYXIN-DEXAMETH 3.5-10000-0.1 OP SUSP
OPHTHALMIC | Status: DC | PRN
  Administered 2021-08-07: 1 [drp] via OPHTHALMIC

## 2021-08-07 MED ORDER — PHENYLEPHRINE-KETOROLAC 1-0.3 % IO SOLN
INTRAOCULAR | Status: AC
  Filled 2021-08-07: qty 4

## 2021-08-07 MED ORDER — EPINEPHRINE PF 1 MG/ML IJ SOLN
INTRAMUSCULAR | Status: AC
  Filled 2021-08-07: qty 2

## 2021-08-07 MED ORDER — PHENYLEPHRINE-KETOROLAC 1-0.3 % IO SOLN
INTRAOCULAR | Status: DC | PRN
  Administered 2021-08-07: 500 mL via OPHTHALMIC

## 2021-08-07 MED ORDER — PHENYLEPHRINE HCL 2.5 % OP SOLN
1.0000 [drp] | OPHTHALMIC | Status: AC | PRN
  Administered 2021-08-07 (×3): 1 [drp] via OPHTHALMIC

## 2021-08-07 SURGICAL SUPPLY — 14 items
CLOTH BEACON ORANGE TIMEOUT ST (SAFETY) ×2 IMPLANT
EYE SHIELD UNIVERSAL CLEAR (GAUZE/BANDAGES/DRESSINGS) ×2 IMPLANT
GLOVE SURG UNDER POLY LF SZ6.5 (GLOVE) ×2 IMPLANT
GLOVE SURG UNDER POLY LF SZ7 (GLOVE) ×2 IMPLANT
NEEDLE HYPO 18GX1.5 BLUNT FILL (NEEDLE) ×2 IMPLANT
PAD ARMBOARD 7.5X6 YLW CONV (MISCELLANEOUS) ×2 IMPLANT
PROC W SPEC LENS (INTRAOCULAR LENS)
PROCESS W SPEC LENS (INTRAOCULAR LENS) IMPLANT
RING MALYGIN 7.0 (MISCELLANEOUS) IMPLANT
SYR TB 1ML LL NO SAFETY (SYRINGE) ×2 IMPLANT
TAPE SURG TRANSPORE 1 IN (GAUZE/BANDAGES/DRESSINGS) ×1 IMPLANT
TAPE SURGICAL TRANSPORE 1 IN (GAUZE/BANDAGES/DRESSINGS) ×1
TECNIS EYHANCE TORIC II  IOL (Intraocular Lens) ×2 IMPLANT
WATER STERILE IRR 250ML POUR (IV SOLUTION) ×2 IMPLANT

## 2021-08-07 NOTE — Anesthesia Procedure Notes (Addendum)
Procedure Name: MAC Date/Time: 08/07/2021 9:59 AM Performed by: Orlie Dakin, CRNA Pre-anesthesia Checklist: Patient identified, Emergency Drugs available, Suction available and Patient being monitored Patient Re-evaluated:Patient Re-evaluated prior to induction Oxygen Delivery Method: Nasal cannula Placement Confirmation: positive ETCO2

## 2021-08-07 NOTE — Transfer of Care (Signed)
Immediate Anesthesia Transfer of Care Note  Patient: [facility]  Procedure(s) Performed: CATARACT EXTRACTION PHACO AND INTRAOCULAR LENS PLACEMENT LEFT EYE (Left: Eye)  Patient Location: Short Stay  Anesthesia Type:MAC  Level of Consciousness: awake, alert  and oriented  Airway & Oxygen Therapy: Patient Spontanous Breathing  Post-op Assessment: Report given to RN and Post -op Vital signs reviewed and stable  Post vital signs: Reviewed and stable  Last Vitals:  Vitals Value Taken Time  BP    Temp    Pulse    Resp    SpO2      Last Pain:  Vitals:   08/07/21 0915  TempSrc: Oral  PainSc: 0-No pain         Complications: No notable events documented.

## 2021-08-07 NOTE — Discharge Instructions (Signed)
Please discharge patient when stable, will follow up today with Dr. Laxmi Choung at the [facility] [place] office immediately following discharge.  Leave shield in place until visit.  All paperwork with discharge instructions will be given at the office.  [facility] Address:  [address]  , [place] [address]  

## 2021-08-07 NOTE — Interval H&P Note (Signed)
History and Physical Interval Note:  08/07/2021 9:53 AM  [facility]  has presented today for surgery, with the diagnosis of Nuclear sclerotic cataract - Left eye.  The various methods of treatment have been discussed with the patient and family. After consideration of risks, benefits and other options for treatment, the patient has consented to  Procedure(s) with comments: CATARACT EXTRACTION PHACO AND INTRAOCULAR LENS PLACEMENT LEFT EYE (Left) - left as a surgical intervention.  The patient's history has been reviewed, patient examined, no change in status, stable for surgery.  I have reviewed the patient's chart and labs.  Questions were answered to the patient's satisfaction.     Fabio Pierce

## 2021-08-07 NOTE — Anesthesia Preprocedure Evaluation (Signed)
Anesthesia Evaluation  Patient identified by MRN, date of birth, ID band Patient awake    Reviewed: Allergy & Precautions, NPO status , Patient's Chart, lab work & pertinent test results, reviewed documented beta blocker date and time   History of Anesthesia Complications Negative for: history of anesthetic complications  Airway Mallampati: II  TM Distance: >3 FB Neck ROM: Full    Dental  (+) Dental Advisory Given, Caps   Pulmonary neg pulmonary ROS, Current Smoker and Patient abstained from smoking.,    Pulmonary exam normal breath sounds clear to auscultation       Cardiovascular Exercise Tolerance: Good hypertension, Pt. on medications and Pt. on home beta blockers Normal cardiovascular exam Rhythm:Regular Rate:Normal     Neuro/Psych negative neurological ROS  negative psych ROS   GI/Hepatic negative GI ROS, Neg liver ROS,   Endo/Other  negative endocrine ROS  Renal/GU negative Renal ROS     Musculoskeletal  (+) Arthritis ,   Abdominal   Peds  Hematology negative hematology ROS (+)   Anesthesia Other Findings   Reproductive/Obstetrics                             Anesthesia Physical  Anesthesia Plan  ASA: 2  Anesthesia Plan: MAC   Post-op Pain Management:    Induction:   PONV Risk Score and Plan:   Airway Management Planned: Nasal Cannula and Natural Airway  Additional Equipment:   Intra-op Plan:   Post-operative Plan:   Informed Consent: I have reviewed the patients History and Physical, chart, labs and discussed the procedure including the risks, benefits and alternatives for the proposed anesthesia with the patient or authorized representative who has indicated his/her understanding and acceptance.     Dental advisory given  Plan Discussed with: CRNA and Surgeon  Anesthesia Plan Comments: (Coffee with cream at 0600; plan monitoring without sedative level  medication)        Anesthesia Quick Evaluation

## 2021-08-07 NOTE — Op Note (Signed)
Date of procedure: 08/07/21  Pre-operative diagnosis: Visually significant age-related combined-form cataract, Left Eye; Visually Significant Astigmatism, Left Eye (H25.812)  Post-operative diagnosis: Visually significant age-related cataract, Left Eye; Visually Significant Astigmatism, Left Eye Intra-operative floppy iris syndrome, left eye (H21.81)  Procedure: Removal of cataract via phacoemulsification and insertion of intra-ocular lens Carla Webb and Johnson [id number] +20.0D into the capsular bag of the Left Eye  Attending surgeon: Gerda Diss. Lynora Dymond, MD, MA  Anesthesia: MAC, Topical Akten  Complications: None  Estimated Blood Loss: <69m (minimal)  Specimens: None  Implants: As above  Indications:  Visually significant age-related cataract, Left Eye; Visually Significant Astigmatism, Left Eye  Procedure:  The patient was seen and identified in the pre-operative area. The operative eye was identified and dilated.  The operative eye was marked.  Pre-operative toric markers were used to mark the eye at 0 and 180 degrees. Topical anesthesia was administered to the operative eye.     The patient was then to the operative suite and placed in the supine position.  A timeout was performed confirming the patient, procedure to be performed, and all other relevant information.   The patient's face was prepped and draped in the usual fashion for intra-ocular surgery.  A lid speculum was placed into the operative eye and the surgical microscope moved into place and focused. Poor dilation of the iris was confirmed. A superotemporal paracentesis was created using a 20 gauge paracentesis blade.  Shugarcaine was injected into the anterior chamber.  Viscoelastic was injected into the anterior chamber.  A temporal clear-corneal main wound incision was created using a 2.473mmicrokeratome. A 7.36m42malyugin ring was placed. A continuous curvilinear capsulorrhexis was initiated using an irrigating cystitome and  completed using capsulorrhexis forceps.  Hydrodissection and hydrodeliniation were performed.  Viscoelastic was injected into the anterior chamber.  A phacoemulsification handpiece and a chopper as a second instrument were used to remove the nucleus and epinucleus. The irrigation/aspiration handpiece was used to remove any remaining cortical material.   The capsular bag was reinflated with viscoelastic, checked, and found to be intact.  The eye was marked to the per-op meridian.  The intraocular lens was inserted into the capsular bag and dialed into place using a Kuglen hook to 38 degrees. The Malyugin ring was removed. The irrigation/aspiration handpiece was used to remove any remaining viscoelastic.  The clear corneal wound and paracentesis wounds were then hydrated and checked with Weck-Cels to be watertight.  The lid-speculum and drape was removed, and the patient's face was cleaned with a wet and dry 4x4.  Maxitrol was instilled in the eye before a clear shield was taped over the eye. The patient was taken to the post-operative care unit in good condition, having tolerated the procedure well.  Post-Op Instructions: The [facility] post-operative evaluation and will receive all other orders and instructions.

## 2021-08-08 ENCOUNTER — Encounter (HOSPITAL_COMMUNITY): Payer: Self-pay | Admitting: Ophthalmology

## 2021-08-08 NOTE — Anesthesia Postprocedure Evaluation (Signed)
Anesthesia Post Note  Patient: [facility]  Procedure(s) Performed: CATARACT EXTRACTION PHACO AND INTRAOCULAR LENS PLACEMENT LEFT EYE (Left: Eye)  Patient location during evaluation: PACU Anesthesia Type: MAC Level of consciousness: awake and alert Pain management: pain level controlled Vital Signs Assessment: post-procedure vital signs reviewed and stable Respiratory status: spontaneous breathing, nonlabored ventilation, respiratory function stable and patient connected to nasal cannula oxygen Cardiovascular status: stable and blood pressure returned to baseline Postop Assessment: no apparent nausea or vomiting Anesthetic complications: no   No notable events documented.   Last Vitals:  Vitals:   08/07/21 0915 08/07/21 1020  BP: (!) 146/73 (!) 144/64  Pulse: (!) 47 (!) 47  Resp: 17 18  Temp: 37 C 36.4 C  SpO2: 100% 100%    Last Pain:  Vitals:   08/07/21 1020  TempSrc: Axillary  PainSc: 0-No pain                 Nicanor Alcon

## 2021-09-02 DIAGNOSIS — E669 Obesity, unspecified: Secondary | ICD-10-CM | POA: Diagnosis not present

## 2021-09-02 DIAGNOSIS — Z791 Long term (current) use of non-steroidal anti-inflammatories (NSAID): Secondary | ICD-10-CM | POA: Diagnosis not present

## 2021-09-02 DIAGNOSIS — I10 Essential (primary) hypertension: Secondary | ICD-10-CM | POA: Diagnosis not present

## 2021-09-02 DIAGNOSIS — I951 Orthostatic hypotension: Secondary | ICD-10-CM | POA: Diagnosis not present

## 2021-09-02 DIAGNOSIS — M1990 Unspecified osteoarthritis, unspecified site: Secondary | ICD-10-CM | POA: Diagnosis not present

## 2021-09-02 DIAGNOSIS — E785 Hyperlipidemia, unspecified: Secondary | ICD-10-CM | POA: Diagnosis not present

## 2021-09-02 DIAGNOSIS — Z720 Tobacco use: Secondary | ICD-10-CM | POA: Diagnosis not present

## 2021-09-02 DIAGNOSIS — G8929 Other chronic pain: Secondary | ICD-10-CM | POA: Diagnosis not present

## 2021-09-02 DIAGNOSIS — Z6833 Body mass index (BMI) 33.0-33.9, adult: Secondary | ICD-10-CM | POA: Diagnosis not present

## 2021-09-14 DIAGNOSIS — E1122 Type 2 diabetes mellitus with diabetic chronic kidney disease: Secondary | ICD-10-CM | POA: Diagnosis not present

## 2021-09-14 DIAGNOSIS — Z6833 Body mass index (BMI) 33.0-33.9, adult: Secondary | ICD-10-CM | POA: Diagnosis not present

## 2021-09-14 DIAGNOSIS — I10 Essential (primary) hypertension: Secondary | ICD-10-CM | POA: Diagnosis not present

## 2021-09-14 DIAGNOSIS — Z23 Encounter for immunization: Secondary | ICD-10-CM | POA: Diagnosis not present

## 2021-09-14 DIAGNOSIS — E1165 Type 2 diabetes mellitus with hyperglycemia: Secondary | ICD-10-CM | POA: Diagnosis not present

## 2021-09-14 DIAGNOSIS — Z299 Encounter for prophylactic measures, unspecified: Secondary | ICD-10-CM | POA: Diagnosis not present

## 2021-09-14 DIAGNOSIS — N183 Chronic kidney disease, stage 3 unspecified: Secondary | ICD-10-CM | POA: Diagnosis not present

## 2021-09-22 DIAGNOSIS — R42 Dizziness and giddiness: Secondary | ICD-10-CM | POA: Diagnosis not present

## 2021-09-22 DIAGNOSIS — Z299 Encounter for prophylactic measures, unspecified: Secondary | ICD-10-CM | POA: Diagnosis not present

## 2021-09-22 DIAGNOSIS — E7800 Pure hypercholesterolemia, unspecified: Secondary | ICD-10-CM | POA: Diagnosis not present

## 2021-09-22 DIAGNOSIS — K529 Noninfective gastroenteritis and colitis, unspecified: Secondary | ICD-10-CM | POA: Diagnosis not present

## 2021-09-22 DIAGNOSIS — F17210 Nicotine dependence, cigarettes, uncomplicated: Secondary | ICD-10-CM | POA: Diagnosis not present

## 2021-10-10 DIAGNOSIS — Z23 Encounter for immunization: Secondary | ICD-10-CM | POA: Diagnosis not present

## 2022-01-09 DIAGNOSIS — E1165 Type 2 diabetes mellitus with hyperglycemia: Secondary | ICD-10-CM | POA: Diagnosis not present

## 2022-01-09 DIAGNOSIS — I10 Essential (primary) hypertension: Secondary | ICD-10-CM | POA: Diagnosis not present

## 2022-01-09 DIAGNOSIS — Z87891 Personal history of nicotine dependence: Secondary | ICD-10-CM | POA: Diagnosis not present

## 2022-01-09 DIAGNOSIS — Z6834 Body mass index (BMI) 34.0-34.9, adult: Secondary | ICD-10-CM | POA: Diagnosis not present

## 2022-01-09 DIAGNOSIS — Z299 Encounter for prophylactic measures, unspecified: Secondary | ICD-10-CM | POA: Diagnosis not present

## 2022-01-09 DIAGNOSIS — E1122 Type 2 diabetes mellitus with diabetic chronic kidney disease: Secondary | ICD-10-CM | POA: Diagnosis not present

## 2022-01-09 DIAGNOSIS — N183 Chronic kidney disease, stage 3 unspecified: Secondary | ICD-10-CM | POA: Diagnosis not present

## 2022-04-06 IMAGING — MG MM DIGITAL SCREENING BILAT W/ TOMO AND CAD
8 series · 9 of 24 positions shown · non-contrast
Comparison: Previous exam(s).

CLINICAL DATA: Screening.

EXAM:
DIGITAL SCREENING BILATERAL MAMMOGRAM WITH TOMOSYNTHESIS AND CAD
TECHNIQUE: Bilateral screening digital craniocaudal and mediolateral oblique
mammograms were obtained. Bilateral screening digital breast
tomosynthesis was performed. The images were evaluated with
computer-aided detection.

[R MLO synth-2D]
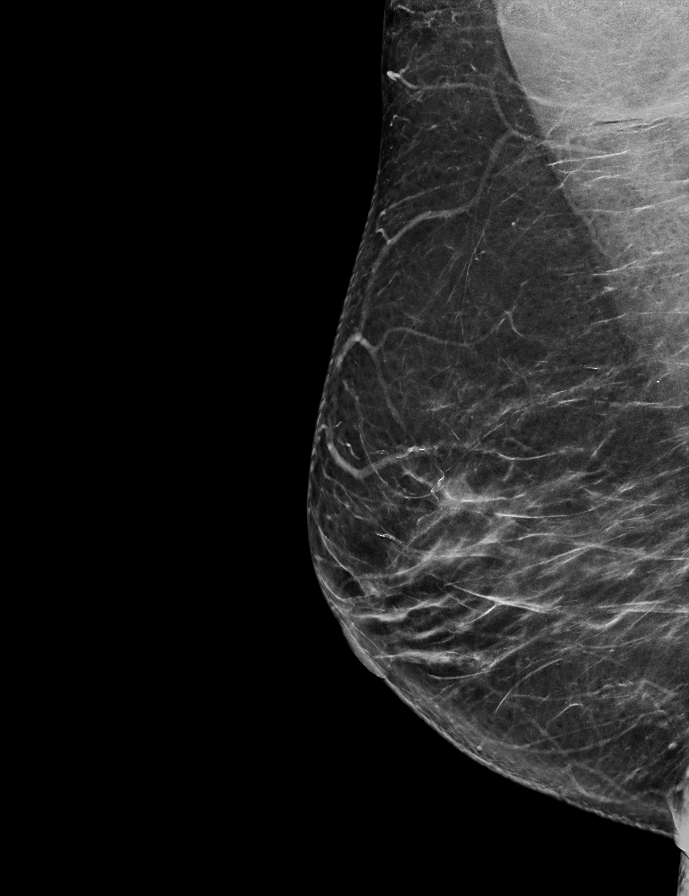

[R CC synth-2D]
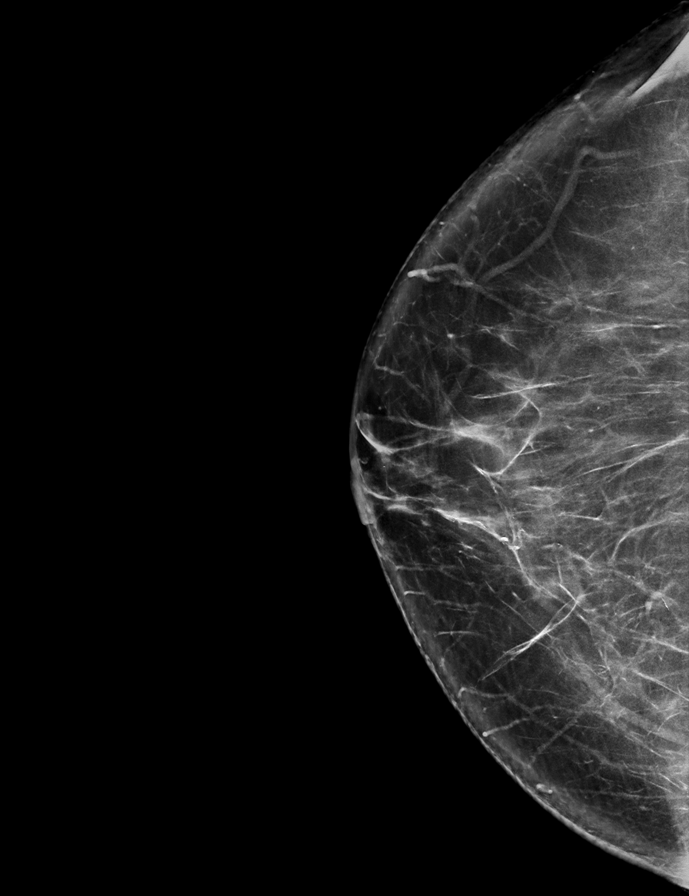

[L CC synth-2D]
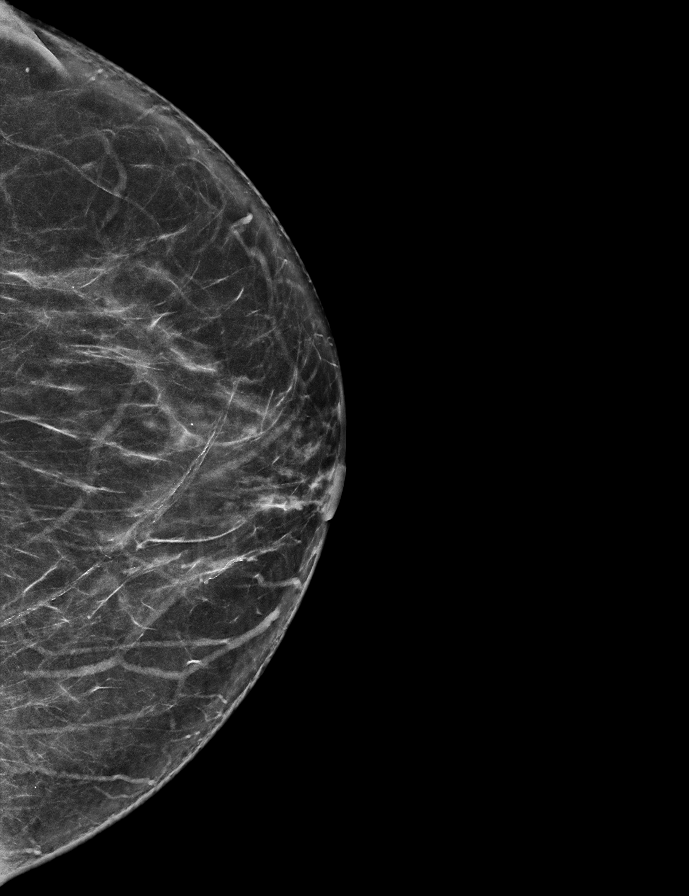

[L MLO synth-2D]
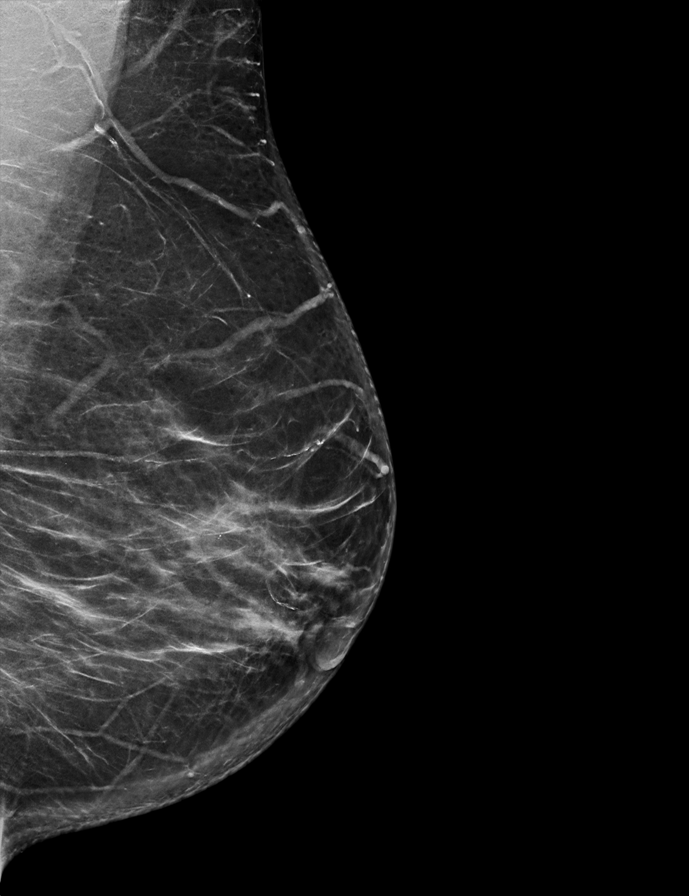

[R MLO tomo · 2 of 73 frames shown]
[frame 24/73]
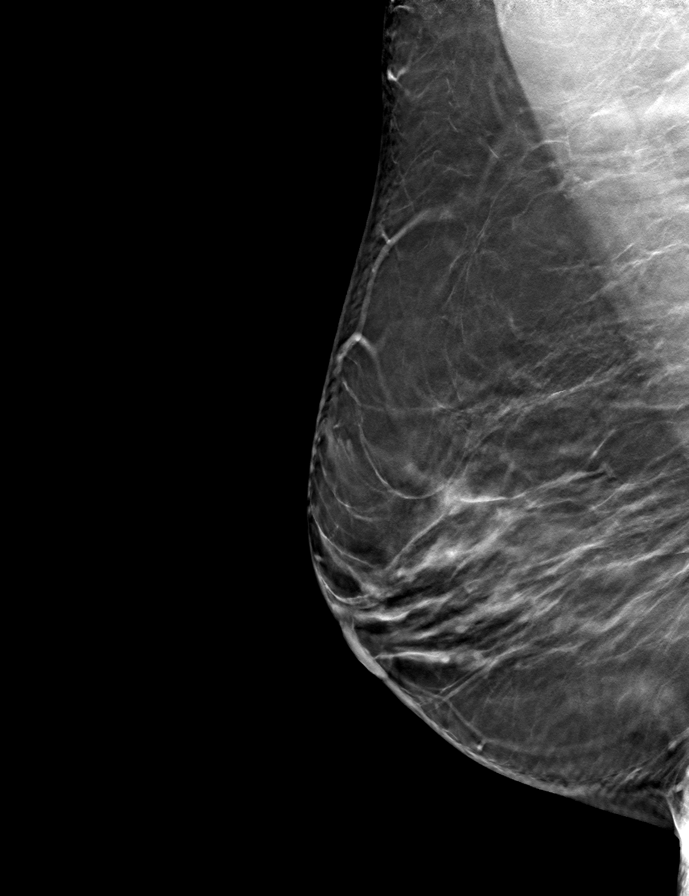
[frame 37/73]
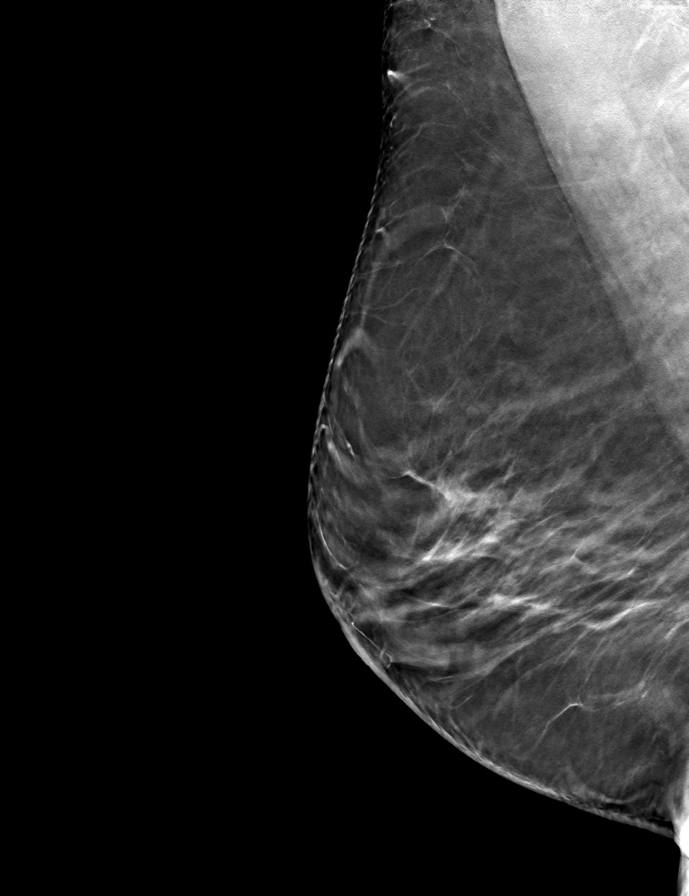

[R CC tomo · tomo slice 41/81.0]
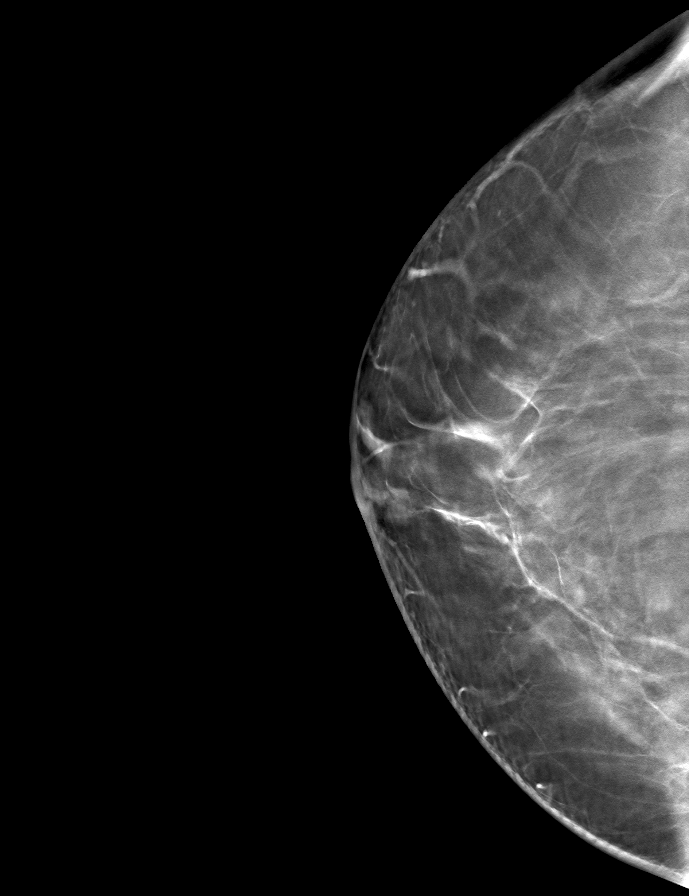

[L CC tomo · tomo slice 35/70.0]
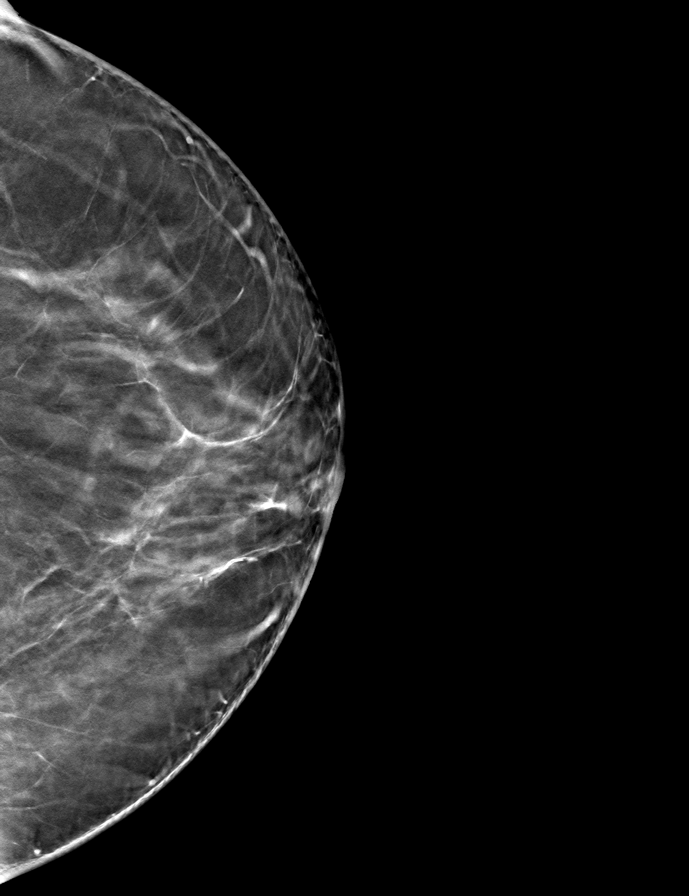

[L MLO tomo · tomo slice 39/76.0]
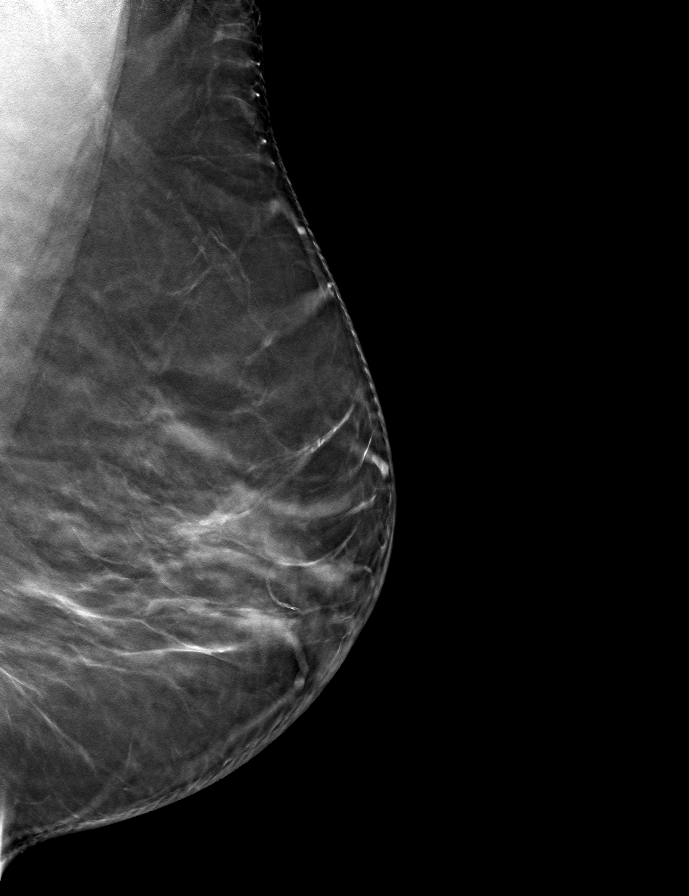

[9 of 24 positions shown; findings below may reference images not displayed]

ACR Breast Density Category b: There are scattered areas of
fibroglandular density.
FINDINGS: There are no findings suspicious for malignancy.
IMPRESSION: No mammographic evidence of malignancy. A result letter of this
screening mammogram will be mailed directly to the patient.

RECOMMENDATION:
Screening mammogram in one year. (Code:51-O-LD2)

BI-RADS CATEGORY  1: Negative.

## 2022-04-17 DIAGNOSIS — M1710 Unilateral primary osteoarthritis, unspecified knee: Secondary | ICD-10-CM | POA: Diagnosis not present

## 2022-04-17 DIAGNOSIS — Z6833 Body mass index (BMI) 33.0-33.9, adult: Secondary | ICD-10-CM | POA: Diagnosis not present

## 2022-04-17 DIAGNOSIS — Z299 Encounter for prophylactic measures, unspecified: Secondary | ICD-10-CM | POA: Diagnosis not present

## 2022-04-17 DIAGNOSIS — N183 Chronic kidney disease, stage 3 unspecified: Secondary | ICD-10-CM | POA: Diagnosis not present

## 2022-04-17 DIAGNOSIS — I10 Essential (primary) hypertension: Secondary | ICD-10-CM | POA: Diagnosis not present

## 2022-04-17 DIAGNOSIS — M170 Bilateral primary osteoarthritis of knee: Secondary | ICD-10-CM | POA: Diagnosis not present

## 2022-04-17 DIAGNOSIS — M25562 Pain in left knee: Secondary | ICD-10-CM | POA: Diagnosis not present

## 2022-04-17 DIAGNOSIS — E1122 Type 2 diabetes mellitus with diabetic chronic kidney disease: Secondary | ICD-10-CM | POA: Diagnosis not present

## 2022-04-17 DIAGNOSIS — M25561 Pain in right knee: Secondary | ICD-10-CM | POA: Diagnosis not present

## 2022-04-17 DIAGNOSIS — E1165 Type 2 diabetes mellitus with hyperglycemia: Secondary | ICD-10-CM | POA: Diagnosis not present

## 2022-05-31 DIAGNOSIS — Z1331 Encounter for screening for depression: Secondary | ICD-10-CM | POA: Diagnosis not present

## 2022-05-31 DIAGNOSIS — Z7189 Other specified counseling: Secondary | ICD-10-CM | POA: Diagnosis not present

## 2022-05-31 DIAGNOSIS — Z0000 Encounter for general adult medical examination without abnormal findings: Secondary | ICD-10-CM | POA: Diagnosis not present

## 2022-05-31 DIAGNOSIS — Z1211 Encounter for screening for malignant neoplasm of colon: Secondary | ICD-10-CM | POA: Diagnosis not present

## 2022-05-31 DIAGNOSIS — Z299 Encounter for prophylactic measures, unspecified: Secondary | ICD-10-CM | POA: Diagnosis not present

## 2022-05-31 DIAGNOSIS — Z1339 Encounter for screening examination for other mental health and behavioral disorders: Secondary | ICD-10-CM | POA: Diagnosis not present

## 2022-05-31 DIAGNOSIS — I10 Essential (primary) hypertension: Secondary | ICD-10-CM | POA: Diagnosis not present

## 2022-05-31 DIAGNOSIS — E6609 Other obesity due to excess calories: Secondary | ICD-10-CM | POA: Diagnosis not present

## 2022-05-31 DIAGNOSIS — Z6834 Body mass index (BMI) 34.0-34.9, adult: Secondary | ICD-10-CM | POA: Diagnosis not present

## 2022-07-02 ENCOUNTER — Ambulatory Visit (INDEPENDENT_AMBULATORY_CARE_PROVIDER_SITE_OTHER): Payer: Medicare PPO

## 2022-07-02 ENCOUNTER — Encounter: Payer: Self-pay | Admitting: Physician Assistant

## 2022-07-02 ENCOUNTER — Ambulatory Visit: Payer: Medicare PPO | Admitting: Physician Assistant

## 2022-07-02 DIAGNOSIS — M25561 Pain in right knee: Secondary | ICD-10-CM

## 2022-07-02 DIAGNOSIS — G8929 Other chronic pain: Secondary | ICD-10-CM | POA: Diagnosis not present

## 2022-07-02 MED ORDER — LIDOCAINE HCL 1 % IJ SOLN
3.0000 mL | INTRAMUSCULAR | Status: AC | PRN
  Administered 2022-07-02: 3 mL

## 2022-07-02 MED ORDER — METHYLPREDNISOLONE ACETATE 40 MG/ML IJ SUSP
40.0000 mg | INTRAMUSCULAR | Status: AC | PRN
  Administered 2022-07-02: 40 mg via INTRA_ARTICULAR
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
## 2022-07-09 DIAGNOSIS — E559 Vitamin D deficiency, unspecified: Secondary | ICD-10-CM | POA: Diagnosis not present

## 2022-07-09 DIAGNOSIS — R5383 Other fatigue: Secondary | ICD-10-CM | POA: Diagnosis not present

## 2022-07-09 DIAGNOSIS — Z79899 Other long term (current) drug therapy: Secondary | ICD-10-CM | POA: Diagnosis not present

## 2022-07-09 DIAGNOSIS — E7800 Pure hypercholesterolemia, unspecified: Secondary | ICD-10-CM | POA: Diagnosis not present

## 2022-07-25 DIAGNOSIS — Z299 Encounter for prophylactic measures, unspecified: Secondary | ICD-10-CM | POA: Diagnosis not present

## 2022-07-25 DIAGNOSIS — Z1212 Encounter for screening for malignant neoplasm of rectum: Secondary | ICD-10-CM | POA: Diagnosis not present

## 2022-07-25 DIAGNOSIS — Z6833 Body mass index (BMI) 33.0-33.9, adult: Secondary | ICD-10-CM | POA: Diagnosis not present

## 2022-07-25 DIAGNOSIS — E1165 Type 2 diabetes mellitus with hyperglycemia: Secondary | ICD-10-CM | POA: Diagnosis not present

## 2022-07-25 DIAGNOSIS — I10 Essential (primary) hypertension: Secondary | ICD-10-CM | POA: Diagnosis not present

## 2022-07-25 DIAGNOSIS — E1122 Type 2 diabetes mellitus with diabetic chronic kidney disease: Secondary | ICD-10-CM | POA: Diagnosis not present

## 2022-07-25 DIAGNOSIS — Z1211 Encounter for screening for malignant neoplasm of colon: Secondary | ICD-10-CM | POA: Diagnosis not present

## 2022-07-25 DIAGNOSIS — N183 Chronic kidney disease, stage 3 unspecified: Secondary | ICD-10-CM | POA: Diagnosis not present

## 2022-07-26 LAB — COLOGUARD: Cologuard: NEGATIVE

## 2022-10-30 DIAGNOSIS — I10 Essential (primary) hypertension: Secondary | ICD-10-CM | POA: Diagnosis not present

## 2022-10-30 DIAGNOSIS — Z299 Encounter for prophylactic measures, unspecified: Secondary | ICD-10-CM | POA: Diagnosis not present

## 2022-10-30 DIAGNOSIS — Z6833 Body mass index (BMI) 33.0-33.9, adult: Secondary | ICD-10-CM | POA: Diagnosis not present

## 2022-10-30 DIAGNOSIS — Z0000 Encounter for general adult medical examination without abnormal findings: Secondary | ICD-10-CM | POA: Diagnosis not present

## 2022-10-30 DIAGNOSIS — E1165 Type 2 diabetes mellitus with hyperglycemia: Secondary | ICD-10-CM | POA: Diagnosis not present

## 2022-10-30 DIAGNOSIS — Z87891 Personal history of nicotine dependence: Secondary | ICD-10-CM | POA: Diagnosis not present

## 2022-12-20 DIAGNOSIS — Z6833 Body mass index (BMI) 33.0-33.9, adult: Secondary | ICD-10-CM | POA: Diagnosis not present

## 2022-12-20 DIAGNOSIS — R519 Headache, unspecified: Secondary | ICD-10-CM | POA: Diagnosis not present

## 2022-12-20 DIAGNOSIS — E1122 Type 2 diabetes mellitus with diabetic chronic kidney disease: Secondary | ICD-10-CM | POA: Diagnosis not present

## 2022-12-20 DIAGNOSIS — I10 Essential (primary) hypertension: Secondary | ICD-10-CM | POA: Diagnosis not present

## 2022-12-20 DIAGNOSIS — Z299 Encounter for prophylactic measures, unspecified: Secondary | ICD-10-CM | POA: Diagnosis not present

## 2022-12-27 DIAGNOSIS — E119 Type 2 diabetes mellitus without complications: Secondary | ICD-10-CM | POA: Diagnosis not present

## 2022-12-27 DIAGNOSIS — Z961 Presence of intraocular lens: Secondary | ICD-10-CM | POA: Diagnosis not present

## 2022-12-27 LAB — HM DIABETES EYE EXAM

## 2023-02-06 DIAGNOSIS — E1122 Type 2 diabetes mellitus with diabetic chronic kidney disease: Secondary | ICD-10-CM | POA: Diagnosis not present

## 2023-02-06 DIAGNOSIS — Z87891 Personal history of nicotine dependence: Secondary | ICD-10-CM | POA: Diagnosis not present

## 2023-02-06 DIAGNOSIS — Z6833 Body mass index (BMI) 33.0-33.9, adult: Secondary | ICD-10-CM | POA: Diagnosis not present

## 2023-02-06 DIAGNOSIS — I10 Essential (primary) hypertension: Secondary | ICD-10-CM | POA: Diagnosis not present

## 2023-02-06 DIAGNOSIS — N183 Chronic kidney disease, stage 3 unspecified: Secondary | ICD-10-CM | POA: Diagnosis not present

## 2023-02-06 DIAGNOSIS — Z299 Encounter for prophylactic measures, unspecified: Secondary | ICD-10-CM | POA: Diagnosis not present

## 2023-02-06 DIAGNOSIS — E1165 Type 2 diabetes mellitus with hyperglycemia: Secondary | ICD-10-CM | POA: Diagnosis not present

## 2023-04-09 ENCOUNTER — Encounter: Payer: Self-pay | Admitting: Internal Medicine

## 2023-04-09 ENCOUNTER — Ambulatory Visit: Payer: Medicare PPO | Admitting: Internal Medicine

## 2023-04-09 VITALS — BP 136/72 | HR 70 | Ht 60.0 in | Wt 171.2 lb

## 2023-04-09 DIAGNOSIS — Z0001 Encounter for general adult medical examination with abnormal findings: Secondary | ICD-10-CM | POA: Diagnosis not present

## 2023-04-09 DIAGNOSIS — Z8639 Personal history of other endocrine, nutritional and metabolic disease: Secondary | ICD-10-CM | POA: Diagnosis not present

## 2023-04-09 DIAGNOSIS — I10 Essential (primary) hypertension: Secondary | ICD-10-CM

## 2023-04-09 DIAGNOSIS — Z1321 Encounter for screening for nutritional disorder: Secondary | ICD-10-CM

## 2023-04-09 DIAGNOSIS — Z1159 Encounter for screening for other viral diseases: Secondary | ICD-10-CM | POA: Diagnosis not present

## 2023-04-09 DIAGNOSIS — Z1329 Encounter for screening for other suspected endocrine disorder: Secondary | ICD-10-CM | POA: Diagnosis not present

## 2023-04-09 DIAGNOSIS — Z720 Tobacco use: Secondary | ICD-10-CM | POA: Diagnosis not present

## 2023-04-09 DIAGNOSIS — Z13220 Encounter for screening for lipoid disorders: Secondary | ICD-10-CM | POA: Diagnosis not present

## 2023-04-09 NOTE — Patient Instructions (Addendum)
It was a pleasure to see you today.  Thank you for giving [place] the opportunity to be involved in your care.  Below is a brief recap of your visit and next steps.  We will plan to see you again in 2-4 weeks.  Summary You have established care today. We will check basic labs and request records from Dr. Margaretmary Eddy office. Follow up in 2-4 weeks to review results and address outstanding preventative care items.

## 2023-04-09 NOTE — Progress Notes (Signed)
New Patient Office Visit  Subjective    Patient ID: Carla Webb, female    DOB: [birth year]  Age: 73 y.o. MRN: [id number]  CC:  Chief Complaint  Patient presents with   Establish Care   HPI [facility] presents to establish care.  She is a 73 year old woman who endorses a past medical history significant for HTN, current tobacco use, and possible diabetes mellitus.  She has measures in been followed by Dr. Sherryll Burger with [facility].  Carla Webb reports feeling well today.  Her acute concerns are requesting to have her hemoglobin A1c checked and to review her current medications.  She is otherwise asymptomatic.  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  She endorses current tobacco use but denies alcohol and illicit drug use.  Acute concerns, chronic medical conditions, and outstanding preventative care items discussed today are individually addressed A/P below.  Outpatient Encounter Medications as of 04/09/2023  Medication Sig   amLODipine (NORVASC) 5 MG tablet Take 5 mg by mouth daily.   aspirin 81 MG tablet Take 81 mg by mouth daily.   hydrochlorothiazide (HYDRODIURIL) 25 MG tablet Take 25 mg by mouth daily.   metoprolol succinate (TOPROL-XL) 25 MG 24 hr tablet Take 25 mg by mouth daily.   [DISCONTINUED] metoprolol tartrate (LOPRESSOR) 25 MG tablet Take 25 mg by mouth daily.   No facility-administered encounter medications on file as of 04/09/2023.    Past Medical History:  Diagnosis Date   Hypertension     Past Surgical History:  Procedure Laterality Date   CATARACT EXTRACTION W/PHACO Right 07/24/2021   Procedure: CATARACT EXTRACTION PHACO AND INTRAOCULAR LENS PLACEMENT (IOC);  Surgeon: Fabio Pierce, MD;  Location: [facility];  Service: Ophthalmology;  Laterality: Right;  CDE  8.98   CATARACT EXTRACTION W/PHACO Left 08/07/2021   Procedure: CATARACT EXTRACTION PHACO AND INTRAOCULAR LENS PLACEMENT LEFT EYE;  Surgeon: Fabio Pierce, MD;  Location: [facility];  Service:  Ophthalmology;  Laterality: Left;  CDE   8.32    History reviewed. No pertinent family history.  Social History   Socioeconomic History   Marital status: Divorced    Spouse name: Not on file   Number of children: Not on file   Years of education: Not on file   Highest education level: Not on file  Occupational History   Not on file  Tobacco Use   Smoking status: Every Day    Packs/day: 0.25    Years: 30.00    Additional pack years: 0.00    Total pack years: 7.50    Types: Cigarettes   Smokeless tobacco: Not on file  Substance and Sexual Activity   Alcohol use: No   Drug use: No   Sexual activity: Not on file  Other Topics Concern   Not on file  Social History Narrative   Not on file   Social Determinants of Health   Financial Resource Strain: Not on file  Food Insecurity: Not on file  Transportation Needs: Not on file  Physical Activity: Not on file  Stress: Not on file  Social Connections: Not on file  Intimate Partner Violence: Not on file   Review of Systems  Constitutional:  Negative for chills and fever.  HENT:  Negative for sore throat.   Respiratory:  Negative for cough and shortness of breath.   Cardiovascular:  Negative for chest pain, palpitations and leg swelling.  Gastrointestinal:  Negative for abdominal pain, blood in stool, constipation, diarrhea, nausea and vomiting.  Genitourinary:  Negative for dysuria and hematuria.  Musculoskeletal:  Negative for myalgias.  Skin:  Negative for itching and rash.  Neurological:  Negative for dizziness and headaches.  Psychiatric/Behavioral:  Negative for depression and suicidal ideas.    Objective    BP 136/72   Pulse 70   Ht 5' (1.524 m)   Wt 171 lb 3.2 oz (77.7 kg)   SpO2 98%   BMI 33.44 kg/m   Physical Exam Vitals reviewed.  Constitutional:      General: She is not in acute distress.    Appearance: Normal appearance. She is not toxic-appearing.  HENT:     Head: Normocephalic and atraumatic.      Right Ear: External ear normal.     Left Ear: External ear normal.     Nose: Nose normal. No congestion or rhinorrhea.     Mouth/Throat:     Mouth: Mucous membranes are moist.     Pharynx: Oropharynx is clear. No oropharyngeal exudate or posterior oropharyngeal erythema.  Eyes:     General: No scleral icterus.    Extraocular Movements: Extraocular movements intact.     Conjunctiva/sclera: Conjunctivae normal.     Pupils: Pupils are equal, round, and reactive to light.  Cardiovascular:     Rate and Rhythm: Normal rate and regular rhythm.     Pulses: Normal pulses.     Heart sounds: Normal heart sounds. No murmur heard.    No friction rub. No gallop.  Pulmonary:     Effort: Pulmonary effort is normal.     Breath sounds: Normal breath sounds. No wheezing, rhonchi or rales.  Abdominal:     General: Abdomen is flat. Bowel sounds are normal. There is no distension.     Palpations: Abdomen is soft.     Tenderness: There is no abdominal tenderness.  Musculoskeletal:        General: No swelling. Normal range of motion.     Cervical back: Normal range of motion.     Right lower leg: No edema.     Left lower leg: No edema.  Lymphadenopathy:     Cervical: No cervical adenopathy.  Skin:    General: Skin is warm and dry.     Capillary Refill: Capillary refill takes less than 2 seconds.     Coloration: Skin is not jaundiced.  Neurological:     General: No focal deficit present.     Mental Status: She is alert and oriented to person, place, and time.  Psychiatric:        Mood and Affect: Mood normal.        Behavior: Behavior normal.    Assessment & Plan:   Problem List Items Addressed This Visit       Essential hypertension    Previous documented history of essential hypertension.  She is currently prescribed amlodipine 5 mg daily, HCTZ 25 mg daily, and metoprolol succinate 25 mg daily.  BP today is 136/72. -No medication changes today. We reviewed her current antihypertensive  regimen and agreed that this is appropriate for now.      Current tobacco use    She endorses current tobacco use, smoking 2 cigarettes/day and has been smoking on and off for 30 years.  She remains precontemplative with regards to complete cessation. -The patient was counseled on the dangers of tobacco use, and was advised to quit.  Reviewed strategies to maximize success, including removing cigarettes and smoking materials from environment, stress management, substitution of other forms of reinforcement, support  of family/friends, and written materials.       History of diabetes mellitus    She endorses a potential history of diabetes mellitus, noting that her A1c has previously been elevated at 6.7.  No previous documentation is available for review.  She also reports that she was previously prescribed metformin. -Repeat A1c and urine microalbumin/creatinine ratio ordered today -Follow-up in 2-4 weeks for lab review and to address outstanding preventive care items      Encounter for general adult medical examination with abnormal findings - Primary    Presenting today to establish care.  Available records and labs been reviewed. -Baseline labs ordered today, including one-time HCV screening -Records from her previous PCP have been requested -We will tentatively plan for follow-up in 2-4 weeks for lab review and to address outstanding preventative care items      Return in about 2 weeks (around 04/23/2023) for lab review.   Billie Lade, MD

## 2023-04-11 LAB — LIPID PANEL
Chol/HDL Ratio: 3.3 ratio (ref 0.0–4.4)
Cholesterol, Total: 212 mg/dL — ABNORMAL HIGH (ref 100–199)
HDL: 64 mg/dL (ref >39)
LDL Chol Calc (NIH): 134 mg/dL — ABNORMAL HIGH (ref 0–99)
Triglycerides: 81 mg/dL (ref 0–149)
VLDL Cholesterol Cal: 14 mg/dL (ref 5–40)

## 2023-04-11 LAB — CBC WITH DIFFERENTIAL/PLATELET
Basophils Absolute: 0.1 10*3/uL (ref 0.0–0.2)
Basos: 1 %
EOS (ABSOLUTE): 0.4 10*3/uL (ref 0.0–0.4)
Eos: 6 %
Hematocrit: 40.5 % (ref 34.0–46.6)
Hemoglobin: 13.8 g/dL (ref 11.1–15.9)
Immature Grans (Abs): 0 10*3/uL (ref 0.0–0.1)
Immature Granulocytes: 0 %
Lymphocytes Absolute: 2.7 10*3/uL (ref 0.7–3.1)
Lymphs: 44 %
MCH: 32.1 pg (ref 26.6–33.0)
MCHC: 34.1 g/dL (ref 31.5–35.7)
MCV: 94 fL (ref 79–97)
Monocytes Absolute: 0.4 10*3/uL (ref 0.1–0.9)
Monocytes: 7 %
Neutrophils Absolute: 2.5 10*3/uL (ref 1.4–7.0)
Neutrophils: 42 %
Platelets: 218 10*3/uL (ref 150–450)
RBC: 4.3 x10E6/uL (ref 3.77–5.28)
RDW: 12.2 % (ref 11.7–15.4)
WBC: 6 10*3/uL (ref 3.4–10.8)

## 2023-04-11 LAB — CMP14+EGFR
ALT: 13 IU/L (ref 0–32)
AST: 20 IU/L (ref 0–40)
Albumin/Globulin Ratio: 1 — ABNORMAL LOW (ref 1.2–2.2)
Albumin: 4.3 g/dL (ref 3.9–4.9)
Alkaline Phosphatase: 71 IU/L (ref 44–121)
BUN/Creatinine Ratio: 16 (ref 12–28)
BUN: 18 mg/dL (ref 8–27)
Bilirubin Total: <0.2 mg/dL (ref 0.0–1.2)
CO2: 25 mmol/L (ref 20–29)
Calcium: 10 mg/dL (ref 8.7–10.3)
Chloride: 103 mmol/L (ref 96–106)
Creatinine, Ser: 1.15 mg/dL — ABNORMAL HIGH (ref 0.57–1.00)
Globulin, Total: 4.1 g/dL (ref 1.5–4.5)
Glucose: 124 mg/dL — ABNORMAL HIGH (ref 70–99)
Potassium: 4 mmol/L (ref 3.5–5.2)
Sodium: 143 mmol/L (ref 134–144)
Total Protein: 8.4 g/dL (ref 6.0–8.5)
eGFR: 51 mL/min/{1.73_m2} — ABNORMAL LOW (ref >59)

## 2023-04-11 LAB — HEMOGLOBIN A1C
Est. average glucose Bld gHb Est-mCnc: 143 mg/dL
Hgb A1c MFr Bld: 6.6 % — ABNORMAL HIGH (ref 4.8–5.6)

## 2023-04-11 LAB — MICROALBUMIN / CREATININE URINE RATIO
Creatinine, Urine: 100.2 mg/dL
Microalb/Creat Ratio: 116 mg/g creat — ABNORMAL HIGH (ref 0–29)
Microalbumin, Urine: 116.7 ug/mL

## 2023-04-11 LAB — HCV AB W REFLEX TO QUANT PCR: HCV Ab: NONREACTIVE

## 2023-04-11 LAB — HCV INTERPRETATION

## 2023-04-11 LAB — TSH+FREE T4
Free T4: 1.2 ng/dL (ref 0.82–1.77)
TSH: 2.34 u[IU]/mL (ref 0.450–4.500)

## 2023-04-11 LAB — B12 AND FOLATE PANEL
Folate: 5.2 ng/mL (ref >3.0)
Vitamin B-12: 563 pg/mL (ref 232–1245)

## 2023-04-11 LAB — VITAMIN D 25 HYDROXY (VIT D DEFICIENCY, FRACTURES): Vit D, 25-Hydroxy: 32.1 ng/mL (ref 30.0–100.0)

## 2023-04-15 ENCOUNTER — Encounter: Payer: Self-pay | Admitting: Internal Medicine

## 2023-04-15 DIAGNOSIS — Z0001 Encounter for general adult medical examination with abnormal findings: Secondary | ICD-10-CM | POA: Insufficient documentation

## 2023-04-15 DIAGNOSIS — Z8639 Personal history of other endocrine, nutritional and metabolic disease: Secondary | ICD-10-CM | POA: Insufficient documentation

## 2023-04-15 DIAGNOSIS — E119 Type 2 diabetes mellitus without complications: Secondary | ICD-10-CM | POA: Insufficient documentation

## 2023-04-15 DIAGNOSIS — Z720 Tobacco use: Secondary | ICD-10-CM | POA: Insufficient documentation

## 2023-04-15 NOTE — Assessment & Plan Note (Signed)
She endorses current tobacco use, smoking 2 cigarettes/day and has been smoking on and off for 30 years.  She remains precontemplative with regards to complete cessation. -The patient was counseled on the dangers of tobacco use, and was advised to quit.  Reviewed strategies to maximize success, including removing cigarettes and smoking materials from environment, stress management, substitution of other forms of reinforcement, support of family/friends, and written materials.

## 2023-04-15 NOTE — Assessment & Plan Note (Signed)
Presenting today to establish care.  Available records and labs been reviewed. -Baseline labs ordered today, including one-time HCV screening -Records from her previous PCP have been requested -We will tentatively plan for follow-up in 2-4 weeks for lab review and to address outstanding preventative care items

## 2023-04-15 NOTE — Assessment & Plan Note (Signed)
She endorses a potential history of diabetes mellitus, noting that her A1c has previously been elevated at 6.7.  No previous documentation is available for review.  She also reports that she was previously prescribed metformin. -Repeat A1c and urine microalbumin/creatinine ratio ordered today -Follow-up in 2-4 weeks for lab review and to address outstanding preventive care items

## 2023-04-15 NOTE — Assessment & Plan Note (Signed)
Previous documented history of essential hypertension.  She is currently prescribed amlodipine 5 mg daily, HCTZ 25 mg daily, and metoprolol succinate 25 mg daily.  BP today is 136/72. -No medication changes today. We reviewed her current antihypertensive regimen and agreed that this is appropriate for now.

## 2023-04-25 ENCOUNTER — Encounter: Payer: Self-pay | Admitting: Internal Medicine

## 2023-04-25 ENCOUNTER — Ambulatory Visit: Payer: Medicare PPO | Admitting: Internal Medicine

## 2023-04-25 VITALS — BP 138/82 | HR 74 | Ht 61.0 in | Wt 172.6 lb

## 2023-04-25 DIAGNOSIS — E1122 Type 2 diabetes mellitus with diabetic chronic kidney disease: Secondary | ICD-10-CM

## 2023-04-25 DIAGNOSIS — E1169 Type 2 diabetes mellitus with other specified complication: Secondary | ICD-10-CM

## 2023-04-25 DIAGNOSIS — I10 Essential (primary) hypertension: Secondary | ICD-10-CM

## 2023-04-25 DIAGNOSIS — E785 Hyperlipidemia, unspecified: Secondary | ICD-10-CM

## 2023-04-25 DIAGNOSIS — N1831 Chronic kidney disease, stage 3a: Secondary | ICD-10-CM | POA: Diagnosis not present

## 2023-04-25 MED ORDER — OLMESARTAN MEDOXOMIL 20 MG PO TABS: 20.0000 mg | ORAL_TABLET | Freq: Every day | ORAL | 2 refills | Status: DC

## 2023-04-25 MED ORDER — EMPAGLIFLOZIN 10 MG PO TABS: 10.0000 mg | ORAL_TABLET | Freq: Every day | ORAL | 2 refills | Status: DC

## 2023-04-25 MED ORDER — ATORVASTATIN CALCIUM 40 MG PO TABS: 40.0000 mg | ORAL_TABLET | Freq: Every day | ORAL | 3 refills | Status: DC

## 2023-04-25 NOTE — Patient Instructions (Signed)
It was a pleasure to see you today.  Thank you for giving [place] the opportunity to be involved in your care.  Below is a brief recap of your visit and next steps.  We will plan to see you again in 1 month.  Summary Start olmesartan 20 mg daily for hypertension.  STOP metoprolol  Continue amlodipine and hydrochlorothiazide   Start Jardiance 10 mg for chronic kidney disease and diabetes  Start atorvastatin 40 mg for high cholesterol  Follow up in 4 weeks

## 2023-04-25 NOTE — Progress Notes (Signed)
Established Patient Office Visit  Subjective   Patient ID: Carla Webb, female    DOB: [birth year]  Age: 73 y.o. MRN: [id number]  Chief Complaint  Patient presents with   lab review    Go over labs    Carla Webb returns to care today for follow-up.  She was last evaluated by me on 4/9 as a new patient presenting to establish care.  At that time she requested to have her hemoglobin A1c checked as well as to review her current medications.  Baseline labs were ordered in 2-week follow-up was arranged for lab review.  There have been no acute interval events.  Carla Webb reports feeling well today.  She is asymptomatic and has no acute concerns to discuss.  Past Medical History:  Diagnosis Date   Hypertension    Past Surgical History:  Procedure Laterality Date   CATARACT EXTRACTION W/PHACO Right 07/24/2021   Procedure: CATARACT EXTRACTION PHACO AND INTRAOCULAR LENS PLACEMENT (IOC);  Surgeon: Fabio Pierce, MD;  Location: [facility];  Service: Ophthalmology;  Laterality: Right;  CDE  8.98   CATARACT EXTRACTION W/PHACO Left 08/07/2021   Procedure: CATARACT EXTRACTION PHACO AND INTRAOCULAR LENS PLACEMENT LEFT EYE;  Surgeon: Fabio Pierce, MD;  Location: [facility];  Service: Ophthalmology;  Laterality: Left;  CDE   8.32   Social History   Tobacco Use   Smoking status: Every Day    Packs/day: 0.25    Years: 30.00    Additional pack years: 0.00    Total pack years: 7.50    Types: Cigarettes  Substance Use Topics   Alcohol use: No   Drug use: No   History reviewed. No pertinent family history. No Known Allergies  Review of Systems  Constitutional:  Negative for chills and fever.  HENT:  Negative for sore throat.   Respiratory:  Negative for cough and shortness of breath.   Cardiovascular:  Negative for chest pain, palpitations and leg swelling.  Gastrointestinal:  Negative for abdominal pain, blood in stool, constipation, diarrhea, nausea and vomiting.  Genitourinary:   Negative for dysuria and hematuria.  Musculoskeletal:  Negative for myalgias.  Skin:  Negative for itching and rash.  Neurological:  Negative for dizziness and headaches.  Psychiatric/Behavioral:  Negative for depression and suicidal ideas.      Objective:     BP 138/82   Pulse 74   Ht 5\' 1"  (1.549 m)   Wt 172 lb 9.6 oz (78.3 kg)   SpO2 98%   BMI 32.61 kg/m  BP Readings from Last 3 Encounters:  04/25/23 138/82  04/09/23 136/72  08/07/21 (!) 144/64   Physical Exam Vitals reviewed.  Constitutional:      General: She is not in acute distress.    Appearance: Normal appearance. She is not toxic-appearing.  HENT:     Head: Normocephalic and atraumatic.     Right Ear: External ear normal.     Left Ear: External ear normal.     Nose: Nose normal. No congestion or rhinorrhea.     Mouth/Throat:     Mouth: Mucous membranes are moist.     Pharynx: Oropharynx is clear. No oropharyngeal exudate or posterior oropharyngeal erythema.  Eyes:     General: No scleral icterus.    Extraocular Movements: Extraocular movements intact.     Conjunctiva/sclera: Conjunctivae normal.     Pupils: Pupils are equal, round, and reactive to light.  Cardiovascular:     Rate and Rhythm: Normal rate and regular rhythm.  Pulses: Normal pulses.     Heart sounds: Normal heart sounds. No murmur heard.    No friction rub. No gallop.  Pulmonary:     Effort: Pulmonary effort is normal.     Breath sounds: Normal breath sounds. No wheezing, rhonchi or rales.  Abdominal:     General: Abdomen is flat. Bowel sounds are normal. There is no distension.     Palpations: Abdomen is soft.     Tenderness: There is no abdominal tenderness.  Musculoskeletal:        General: No swelling. Normal range of motion.     Cervical back: Normal range of motion.     Right lower leg: No edema.     Left lower leg: No edema.  Lymphadenopathy:     Cervical: No cervical adenopathy.  Skin:    General: Skin is warm and dry.      Capillary Refill: Capillary refill takes less than 2 seconds.     Coloration: Skin is not jaundiced.  Neurological:     General: No focal deficit present.     Mental Status: She is alert and oriented to person, place, and time.  Psychiatric:        Mood and Affect: Mood normal.        Behavior: Behavior normal.   Last CBC Lab Results  Component Value Date   WBC 6.0 04/09/2023   HGB 13.8 04/09/2023   HCT 40.5 04/09/2023   MCV 94 04/09/2023   MCH 32.1 04/09/2023   RDW 12.2 04/09/2023   PLT 218 04/09/2023   Last metabolic panel Lab Results  Component Value Date   GLUCOSE 132 (H) 04/25/2023   NA 141 04/25/2023   K 3.4 (L) 04/25/2023   CL 102 04/25/2023   CO2 22 04/25/2023   BUN 19 04/25/2023   CREATININE 1.18 (H) 04/25/2023   EGFR 50 (L) 04/25/2023   CALCIUM 10.0 04/25/2023   PROT 8.4 04/09/2023   ALBUMIN 4.3 04/09/2023   LABGLOB 4.1 04/09/2023   AGRATIO 1.0 (L) 04/09/2023   BILITOT <0.2 04/09/2023   ALKPHOS 71 04/09/2023   AST 20 04/09/2023   ALT 13 04/09/2023   ANIONGAP 13 09/09/2014   Last lipids Lab Results  Component Value Date   CHOL 212 (H) 04/09/2023   HDL 64 04/09/2023   LDLCALC 134 (H) 04/09/2023   TRIG 81 04/09/2023   CHOLHDL 3.3 04/09/2023   Last hemoglobin A1c Lab Results  Component Value Date   HGBA1C 6.6 (H) 04/09/2023   Last thyroid functions Lab Results  Component Value Date   TSH 2.340 04/09/2023   Last vitamin D Lab Results  Component Value Date   VD25OH 32.1 04/09/2023   Last vitamin B12 and Folate Lab Results  Component Value Date   VITAMINB12 563 04/09/2023   FOLATE 5.2 04/09/2023   The 10-year ASCVD risk score (Arnett DK, et al., 2019) is: 39%    Assessment & Plan:   Problem List Items Addressed This Visit       Essential hypertension - Primary    BP 130/80.  Her current antihypertensive regimen consists of amlodipine 5 mg daily, HCTZ 25 mg daily, and metoprolol succinate 25 mg daily. -Discontinue metoprolol and  start olmesartan 20 mg daily for renal protection in the setting of CKD and diabetes mellitus -Follow-up in 4 weeks for HTN check      Type 2 diabetes mellitus (HCC)    A1c 6.6 on labs from earlier this month. -Starting Jardiance, ARB, and statin  therapy as noted above -She acknowledges that there is room for significant improvement in her diet and she would prefer to attempt dietary changes before adding additional medications for now. -Follow-up in 4 weeks for reassessment and to address outstanding diabetes related preventative care items      Hyperlipidemia associated with type 2 diabetes mellitus (HCC)    Lipid panel updated earlier this month.  Total cholesterol 212 and LDL 134.  Her 10-year ASCVD risk today is 22.5%. -Start atorvastatin 40 mg daily      CKD (chronic kidney disease) stage 3, GFR 30-59 ml/min (HCC)    Labs consistent with CKD 3A.  Moderately increased urine microalbumin/creatinine ratio noted on recent labs. -Add Jardiance 10 mg daily in addition to starting olmesartan as noted above -Follow-up in 4 weeks for reassessment       Return in about 4 weeks (around 05/23/2023) for HTN, DM.    Billie Lade, MD

## 2023-04-26 LAB — BASIC METABOLIC PANEL
BUN/Creatinine Ratio: 16 (ref 12–28)
BUN: 19 mg/dL (ref 8–27)
CO2: 22 mmol/L (ref 20–29)
Calcium: 10 mg/dL (ref 8.7–10.3)
Chloride: 102 mmol/L (ref 96–106)
Creatinine, Ser: 1.18 mg/dL — ABNORMAL HIGH (ref 0.57–1.00)
Glucose: 132 mg/dL — ABNORMAL HIGH (ref 70–99)
Potassium: 3.4 mmol/L — ABNORMAL LOW (ref 3.5–5.2)
Sodium: 141 mmol/L (ref 134–144)
eGFR: 50 mL/min/{1.73_m2} — ABNORMAL LOW (ref >59)

## 2023-05-01 DIAGNOSIS — N183 Chronic kidney disease, stage 3 unspecified: Secondary | ICD-10-CM | POA: Insufficient documentation

## 2023-05-01 DIAGNOSIS — E1169 Type 2 diabetes mellitus with other specified complication: Secondary | ICD-10-CM | POA: Insufficient documentation

## 2023-05-01 NOTE — Assessment & Plan Note (Signed)
BP 130/80.  Her current antihypertensive regimen consists of amlodipine 5 mg daily, HCTZ 25 mg daily, and metoprolol succinate 25 mg daily. -Discontinue metoprolol and start olmesartan 20 mg daily for renal protection in the setting of CKD and diabetes mellitus -Follow-up in 4 weeks for HTN check

## 2023-05-01 NOTE — Assessment & Plan Note (Signed)
Labs consistent with CKD 3A.  Moderately increased urine microalbumin/creatinine ratio noted on recent labs. -Add Jardiance 10 mg daily in addition to starting olmesartan as noted above -Follow-up in 4 weeks for reassessment

## 2023-05-01 NOTE — Assessment & Plan Note (Signed)
Lipid panel updated earlier this month.  Total cholesterol 212 and LDL 134.  Her 10-year ASCVD risk today is 22.5%. -Start atorvastatin 40 mg daily

## 2023-05-01 NOTE — Assessment & Plan Note (Signed)
A1c 6.6 on labs from earlier this month. -Starting Jardiance, ARB, and statin therapy as noted above -She acknowledges that there is room for significant improvement in her diet and she would prefer to attempt dietary changes before adding additional medications for now. -Follow-up in 4 weeks for reassessment and to address outstanding diabetes related preventative care items

## 2023-05-07 ENCOUNTER — Telehealth: Payer: Self-pay | Admitting: Internal Medicine

## 2023-05-07 NOTE — Telephone Encounter (Signed)
Spoke with pt

## 2023-05-07 NOTE — Telephone Encounter (Signed)
Patient calling about lab results. 

## 2023-05-23 ENCOUNTER — Other Ambulatory Visit: Payer: Self-pay

## 2023-05-23 ENCOUNTER — Encounter: Payer: Self-pay | Admitting: Internal Medicine

## 2023-05-23 ENCOUNTER — Ambulatory Visit: Payer: Medicare PPO | Admitting: Internal Medicine

## 2023-05-23 VITALS — BP 107/61 | HR 83 | Ht 60.0 in | Wt 170.0 lb

## 2023-05-23 DIAGNOSIS — E1122 Type 2 diabetes mellitus with diabetic chronic kidney disease: Secondary | ICD-10-CM | POA: Diagnosis not present

## 2023-05-23 DIAGNOSIS — N1831 Chronic kidney disease, stage 3a: Secondary | ICD-10-CM | POA: Diagnosis not present

## 2023-05-23 DIAGNOSIS — E1169 Type 2 diabetes mellitus with other specified complication: Secondary | ICD-10-CM

## 2023-05-23 DIAGNOSIS — I10 Essential (primary) hypertension: Secondary | ICD-10-CM | POA: Diagnosis not present

## 2023-05-23 DIAGNOSIS — Z7984 Long term (current) use of oral hypoglycemic drugs: Secondary | ICD-10-CM

## 2023-05-23 DIAGNOSIS — Z1231 Encounter for screening mammogram for malignant neoplasm of breast: Secondary | ICD-10-CM

## 2023-05-23 DIAGNOSIS — E785 Hyperlipidemia, unspecified: Secondary | ICD-10-CM

## 2023-05-23 MED ORDER — ROSUVASTATIN CALCIUM 20 MG PO TABS: 20.0000 mg | ORAL_TABLET | Freq: Every day | ORAL | 3 refills | Status: DC

## 2023-05-23 NOTE — Assessment & Plan Note (Signed)
Lipid panel updated last month.  Total cholesterol 212 and LDL 134.  Her 10-year ASCVD risk was significantly elevated, high intensity statin therapy indicated.  Atorvastatin 40 mg daily was prescribed.  Today she reports that she has only been able to tolerate 1/2 tablet of atorvastatin due to heart palpitations.  She would like to switch medications today. -Discontinue atorvastatin -Start rosuvastatin 20 mg daily -Repeat CMP ordered today -Follow-up in 3 months for reassessment

## 2023-05-23 NOTE — Assessment & Plan Note (Signed)
Screening mammogram ordered today ?

## 2023-05-23 NOTE — Progress Notes (Signed)
Established Patient Office Visit  Subjective   Patient ID: Carla Webb, female    DOB: 11/17/1950  Age: 73 y.o. MRN: [id number]  Chief Complaint  Patient presents with   Hypertension    Follow up   [name] returns to care today for HTN and DM follow-up.  Last evaluated by me on 4/25 at which time metoprolol was discontinued in favor of olmesartan 20 mg daily in the setting of CKD and diabetes mellitus.  4-week follow-up was arranged for HTN check.  Additionally, Jardiance and atorvastatin were added to her medication regimens in the setting of DM and CKD.  There have been no acute interval events.  [name] reports feeling well today.  She has no additional concerns to discuss, however she reports that she has only been able to tolerate 1/2 tablet of atorvastatin due to heart palpitations.  She is interested in trying a different medication.  Past Medical History:  Diagnosis Date   Hypertension    Past Surgical History:  Procedure Laterality Date   CATARACT EXTRACTION W/PHACO Right 07/24/2021   Procedure: CATARACT EXTRACTION PHACO AND INTRAOCULAR LENS PLACEMENT (IOC);  Surgeon: Fabio Pierce, MD;  Location: [facility];  Service: Ophthalmology;  Laterality: Right;  CDE  8.98   CATARACT EXTRACTION W/PHACO Left 08/07/2021   Procedure: CATARACT EXTRACTION PHACO AND INTRAOCULAR LENS PLACEMENT LEFT EYE;  Surgeon: Fabio Pierce, MD;  Location: [facility];  Service: Ophthalmology;  Laterality: Left;  CDE   8.32   Social History   Tobacco Use   Smoking status: Every Day    Packs/day: 0.25    Years: 30.00    Additional pack years: 0.00    Total pack years: 7.50    Types: Cigarettes  Substance Use Topics   Alcohol use: No   Drug use: No   History reviewed. No pertinent family history. No Known Allergies  Review of Systems  Constitutional:  Negative for chills and fever.  HENT:  Negative for sore throat.   Respiratory:  Negative for cough and shortness of breath.    Cardiovascular:  Negative for chest pain, palpitations and leg swelling.  Gastrointestinal:  Negative for abdominal pain, blood in stool, constipation, diarrhea, nausea and vomiting.  Genitourinary:  Negative for dysuria and hematuria.  Musculoskeletal:  Negative for myalgias.  Skin:  Negative for itching and rash.  Neurological:  Negative for dizziness and headaches.  Psychiatric/Behavioral:  Negative for depression and suicidal ideas.      Objective:     BP 107/61   Pulse 83   Ht 5' (1.524 m)   Wt 170 lb (77.1 kg)   SpO2 96%   BMI 33.20 kg/m  BP Readings from Last 3 Encounters:  05/23/23 107/61  04/25/23 138/82  04/09/23 136/72   Physical Exam Vitals reviewed.  Constitutional:      General: She is not in acute distress.    Appearance: Normal appearance. She is obese. She is not toxic-appearing.  HENT:     Head: Normocephalic and atraumatic.     Right Ear: External ear normal.     Left Ear: External ear normal.     Nose: Nose normal. No congestion or rhinorrhea.     Mouth/Throat:     Mouth: Mucous membranes are moist.     Pharynx: Oropharynx is clear. No oropharyngeal exudate or posterior oropharyngeal erythema.  Eyes:     General: No scleral icterus.    Extraocular Movements: Extraocular movements intact.     Conjunctiva/sclera: Conjunctivae normal.  Pupils: Pupils are equal, round, and reactive to light.  Cardiovascular:     Rate and Rhythm: Normal rate and regular rhythm.     Pulses: Normal pulses.     Heart sounds: Normal heart sounds. No murmur heard.    No friction rub. No gallop.  Pulmonary:     Effort: Pulmonary effort is normal.     Breath sounds: Normal breath sounds. No wheezing, rhonchi or rales.  Abdominal:     General: Abdomen is flat. Bowel sounds are normal. There is no distension.     Palpations: Abdomen is soft.     Tenderness: There is no abdominal tenderness.  Musculoskeletal:        General: No swelling. Normal range of motion.      Cervical back: Normal range of motion.     Right lower leg: No edema.     Left lower leg: No edema.  Lymphadenopathy:     Cervical: No cervical adenopathy.  Skin:    General: Skin is warm and dry.     Capillary Refill: Capillary refill takes less than 2 seconds.     Coloration: Skin is not jaundiced.  Neurological:     General: No focal deficit present.     Mental Status: She is alert and oriented to person, place, and time.  Psychiatric:        Mood and Affect: Mood normal.        Behavior: Behavior normal.   Last CBC Lab Results  Component Value Date   WBC 6.0 04/09/2023   HGB 13.8 04/09/2023   HCT 40.5 04/09/2023   MCV 94 04/09/2023   MCH 32.1 04/09/2023   RDW 12.2 04/09/2023   PLT 218 04/09/2023   Last metabolic panel Lab Results  Component Value Date   GLUCOSE 132 (H) 04/25/2023   NA 141 04/25/2023   K 3.4 (L) 04/25/2023   CL 102 04/25/2023   CO2 22 04/25/2023   BUN 19 04/25/2023   CREATININE 1.18 (H) 04/25/2023   EGFR 50 (L) 04/25/2023   CALCIUM 10.0 04/25/2023   PROT 8.4 04/09/2023   ALBUMIN 4.3 04/09/2023   LABGLOB 4.1 04/09/2023   AGRATIO 1.0 (L) 04/09/2023   BILITOT <0.2 04/09/2023   ALKPHOS 71 04/09/2023   AST 20 04/09/2023   ALT 13 04/09/2023   ANIONGAP 13 09/09/2014   Last lipids Lab Results  Component Value Date   CHOL 212 (H) 04/09/2023   HDL 64 04/09/2023   LDLCALC 134 (H) 04/09/2023   TRIG 81 04/09/2023   CHOLHDL 3.3 04/09/2023   Last hemoglobin A1c Lab Results  Component Value Date   HGBA1C 6.6 (H) 04/09/2023   Last thyroid functions Lab Results  Component Value Date   TSH 2.340 04/09/2023   Last vitamin D Lab Results  Component Value Date   VD25OH 32.1 04/09/2023   Last vitamin B12 and Folate Lab Results  Component Value Date   VITAMINB12 563 04/09/2023   FOLATE 5.2 04/09/2023   The 10-year ASCVD risk score (Arnett DK, et al., 2019) is: 25.6%    Assessment & Plan:   Problem List Items Addressed This Visit        Essential hypertension    Returning to care today for HTN follow-up.  Metoprolol was discontinued at her last appointment in favor of olmesartan 20 mg daily for renal protection in the setting of CKD and diabetes mellitus.  BP today is 107/61.  She is additionally prescribed amlodipine 5 mg daily and HCTZ 25  mg daily. -No medication changes today.  Continue current antihypertensive regimen. -Repeat CMP ordered today      Type 2 diabetes mellitus (HCC)    A1c 6.6 on labs from last month.  Jardiance 10 mg daily was started at her last appointment.  She is not expensing adverse side effects since starting Jardiance. -No medication changes today -Repeat CMP ordered -Follow-up in 3 months for reassessment and to further address outstanding diabetes related preventative care items.      Hyperlipidemia associated with type 2 diabetes mellitus (HCC)    Lipid panel updated last month.  Total cholesterol 212 and LDL 134.  Her 10-year ASCVD risk was significantly elevated, high intensity statin therapy indicated.  Atorvastatin 40 mg daily was prescribed.  Today she reports that she has only been able to tolerate 1/2 tablet of atorvastatin due to heart palpitations.  She would like to switch medications today. -Discontinue atorvastatin -Start rosuvastatin 20 mg daily -Repeat CMP ordered today -Follow-up in 3 months for reassessment      CKD (chronic kidney disease) stage 3, GFR 30-59 ml/min (HCC)    Jardiance added last month in the setting of CKD and diabetes mellitus.  Moderately increased urine microalbumin/creatinine ratio noted on recent labs.  She has not experienced any adverse side effects since starting Jardiance. -Repeat CMP ordered today      Breast cancer screening by mammogram    Screening mammogram ordered today      Return in about 3 months (around 08/23/2023).   Billie Lade, MD

## 2023-05-23 NOTE — Patient Instructions (Signed)
It was a pleasure to see you today.  Thank you for giving [place] the opportunity to be involved in your care.  Below is a brief recap of your visit and next steps.  We will plan to see you again in 3 months.  Summary Stop atorvastatin Start rosuvastatin 20 mg daily Follow up in 3 months

## 2023-05-23 NOTE — Assessment & Plan Note (Signed)
Jardiance added last month in the setting of CKD and diabetes mellitus.  Moderately increased urine microalbumin/creatinine ratio noted on recent labs.  She has not experienced any adverse side effects since starting Jardiance. -Repeat CMP ordered today

## 2023-05-23 NOTE — Assessment & Plan Note (Signed)
Returning to care today for HTN follow-up.  Metoprolol was discontinued at her last appointment in favor of olmesartan 20 mg daily for renal protection in the setting of CKD and diabetes mellitus.  BP today is 107/61.  She is additionally prescribed amlodipine 5 mg daily and HCTZ 25 mg daily. -No medication changes today.  Continue current antihypertensive regimen. -Repeat CMP ordered today

## 2023-05-23 NOTE — Assessment & Plan Note (Signed)
A1c 6.6 on labs from last month.  Jardiance 10 mg daily was started at her last appointment.  She is not expensing adverse side effects since starting Jardiance. -No medication changes today -Repeat CMP ordered -Follow-up in 3 months for reassessment and to further address outstanding diabetes related preventative care items.

## 2023-05-24 LAB — CMP14+EGFR
ALT: 18 IU/L (ref 0–32)
AST: 18 IU/L (ref 0–40)
Albumin/Globulin Ratio: 1.2 (ref 1.2–2.2)
Albumin: 4.5 g/dL (ref 3.9–4.9)
Alkaline Phosphatase: 77 IU/L (ref 44–121)
BUN/Creatinine Ratio: 18 (ref 12–28)
BUN: 25 mg/dL (ref 8–27)
Bilirubin Total: 0.3 mg/dL (ref 0.0–1.2)
CO2: 24 mmol/L (ref 20–29)
Calcium: 10 mg/dL (ref 8.7–10.3)
Chloride: 101 mmol/L (ref 96–106)
Creatinine, Ser: 1.37 mg/dL — ABNORMAL HIGH (ref 0.57–1.00)
Globulin, Total: 3.9 g/dL (ref 1.5–4.5)
Glucose: 146 mg/dL — ABNORMAL HIGH (ref 70–99)
Potassium: 3.6 mmol/L (ref 3.5–5.2)
Sodium: 141 mmol/L (ref 134–144)
Total Protein: 8.4 g/dL (ref 6.0–8.5)
eGFR: 42 mL/min/{1.73_m2} — ABNORMAL LOW (ref >59)

## 2023-05-30 ENCOUNTER — Ambulatory Visit (INDEPENDENT_AMBULATORY_CARE_PROVIDER_SITE_OTHER): Payer: Medicare PPO

## 2023-05-30 DIAGNOSIS — Z0000 Encounter for general adult medical examination without abnormal findings: Secondary | ICD-10-CM

## 2023-05-30 NOTE — Patient Instructions (Signed)
  Carla Webb , Thank you for taking time to come for your Medicare Wellness Visit. I appreciate your ongoing commitment to your health goals. Please review the following plan we discussed and let me know if I can assist you in the future.   These are the goals we discussed:  Goals      Patient Stated     Lose 10 more pounds        This is a list of the screening recommended for you and due dates:  Health Maintenance  Topic Date Due   COVID-19 Vaccine (1) Never done   Pneumonia Vaccine (1 of 2 - PCV) Never done   Complete foot exam   Never done   Eye exam for diabetics  Never done   DTaP/Tdap/Td vaccine (1 - Tdap) Never done   Colon Cancer Screening  Never done   Zoster (Shingles) Vaccine (1 of 2) Never done   DEXA scan (bone density measurement)  Never done   Mammogram  07/01/2023   Flu Shot  08/01/2023   Hemoglobin A1C  10/09/2023   Yearly kidney health urinalysis for diabetes  04/08/2024   Yearly kidney function blood test for diabetes  05/22/2024   Medicare Annual Wellness Visit  05/29/2024   Hepatitis C Screening  Completed   HPV Vaccine  Aged Out

## 2023-05-30 NOTE — Progress Notes (Signed)
Subjective:   Carla Webb is a 73 y.o. female who presents for Medicare Annual (Subsequent) preventive examination.  Review of Systems    I connected with  [facility] on 05/30/23 by a audio enabled telemedicine application and verified that I am speaking with the correct person using two identifiers.  Patient Location: Home  Provider Location: Office/Clinic  I discussed the limitations of evaluation and management by telemedicine. The patient expressed understanding and agreed to proceed.        Objective:    There were no vitals filed for this visit. There is no height or weight on file to calculate BMI.     08/03/2021    1:52 PM 09/09/2014    1:15 AM  Advanced Directives  Does Patient Have a Medical Advance Directive? No No  Would patient like information on creating a medical advance directive? No - Patient declined     Current Medications (verified) Outpatient Encounter Medications as of 05/30/2023  Medication Sig   amLODipine (NORVASC) 5 MG tablet Take 5 mg by mouth daily.   aspirin 81 MG tablet Take 81 mg by mouth daily.   empagliflozin (JARDIANCE) 10 MG TABS tablet Take 1 tablet (10 mg total) by mouth daily before breakfast.   hydrochlorothiazide (HYDRODIURIL) 25 MG tablet Take 25 mg by mouth daily.   olmesartan (BENICAR) 20 MG tablet Take 1 tablet (20 mg total) by mouth daily.   rosuvastatin (CRESTOR) 20 MG tablet Take 1 tablet (20 mg total) by mouth daily.   No facility-administered encounter medications on file as of 05/30/2023.    Allergies (verified) Patient has no known allergies.   History: Past Medical History:  Diagnosis Date   Hypertension    Past Surgical History:  Procedure Laterality Date   CATARACT EXTRACTION W/PHACO Right 07/24/2021   Procedure: CATARACT EXTRACTION PHACO AND INTRAOCULAR LENS PLACEMENT (IOC);  Surgeon: Fabio Pierce, MD;  Location: [facility];  Service: Ophthalmology;  Laterality: Right;  CDE  8.98   CATARACT EXTRACTION  W/PHACO Left 08/07/2021   Procedure: CATARACT EXTRACTION PHACO AND INTRAOCULAR LENS PLACEMENT LEFT EYE;  Surgeon: Fabio Pierce, MD;  Location: [facility];  Service: Ophthalmology;  Laterality: Left;  CDE   8.32   No family history on file. Social History   Socioeconomic History   Marital status: Divorced    Spouse name: Not on file   Number of children: Not on file   Years of education: Not on file   Highest education level: Not on file  Occupational History   Not on file  Tobacco Use   Smoking status: Every Day    Packs/day: 0.25    Years: 30.00    Additional pack years: 0.00    Total pack years: 7.50    Types: Cigarettes   Smokeless tobacco: Not on file  Substance and Sexual Activity   Alcohol use: No   Drug use: No   Sexual activity: Not on file  Other Topics Concern   Not on file  Social History Narrative   Not on file   Social Determinants of Health   Financial Resource Strain: Not on file  Food Insecurity: Not on file  Transportation Needs: Not on file  Physical Activity: Not on file  Stress: Not on file  Social Connections: Not on file    Tobacco Counseling Ready to quit: Not Answered Counseling given: Not Answered   Clinical Intake:   Diabetic?yes Nutrition Risk Assessment:  Has the patient had any N/V/D within the last 2  months?  No  Does the patient have any non-healing wounds?  No  Has the patient had any unintentional weight loss or weight gain?  No   Diabetes:  Is the patient diabetic?  yes If diabetic, was a CBG obtained today?  No  Did the patient bring in their glucometer from home?  No  How often do you monitor your CBG's? Once weekly.   Financial Strains and Diabetes Management:  Are you having any financial strains with the device, your supplies or your medication? No .  Does the patient want to be seen by Chronic Care Management for management of their diabetes?  No  Would the patient like to be referred to a Nutritionist or for  Diabetic Management?  No   Diabetic Exams:  Diabetic Eye Exam: Overdue for diabetic eye exam. Pt has been advised about the importance in completing this exam. Patient advised to call and schedule an eye exam. Diabetic Foot Exam: Overdue, Pt has been advised about the importance in completing this exam. Pt is scheduled for diabetic foot exam on  .          Activities of Daily Living     No data to display           Patient Care Team: Billie Lade, MD as PCP - General (Internal Medicine)  Indicate any recent Medical Services you may have received from other than [facility] providers in the past year (date may be approximate).     Assessment:   This is a routine wellness examination for Carla Webb.  Hearing/Vision screen No results found.  Dietary issues and exercise activities discussed:     Goals Addressed   None   Depression Screen    05/23/2023    8:45 AM 04/25/2023    9:10 AM 04/09/2023    9:27 AM  PHQ 2/9 Scores  PHQ - 2 Score 0 0 0  PHQ- 9 Score 0 0 0    Fall Risk    05/23/2023    8:45 AM 04/25/2023    9:10 AM 04/09/2023    9:26 AM  Fall Risk   Falls in the past year? 0 0 0  Number falls in past yr: 0 0 0  Injury with Fall? 0 0 0  Risk for fall due to : No Fall Risks No Fall Risks No Fall Risks  Follow up Falls evaluation completed Falls evaluation completed Falls evaluation completed    FALL RISK PREVENTION PERTAINING TO THE HOME:  Any stairs in or around the home? Yes  If so, are there any without handrails? No  Home free of loose throw rugs in walkways, pet beds, electrical cords, etc? Yes  Adequate lighting in your home to reduce risk of falls? Yes   ASSISTIVE DEVICES UTILIZED TO PREVENT FALLS:  Life alert? No  Use of a cane, walker or w/c? No  Grab bars in the bathroom? No  Shower chair or bench in shower? No  Elevated toilet seat or a handicapped toilet? No   Immunizations  There is no immunization history on file for this patient.  TDAP  status: Due, Education has been provided regarding the importance of this vaccine. Advised may receive this vaccine at local pharmacy or Health Dept. Aware to provide a copy of the vaccination record if obtained from local pharmacy or Health Dept. Verbalized acceptance and understanding.  Flu Vaccine status: Due, Education has been provided regarding the importance of this vaccine. Advised may receive this vaccine at  local pharmacy or Health Dept. Aware to provide a copy of the vaccination record if obtained from local pharmacy or Health Dept. Verbalized acceptance and understanding.  Pneumococcal vaccine status: Due, Education has been provided regarding the importance of this vaccine. Advised may receive this vaccine at local pharmacy or Health Dept. Aware to provide a copy of the vaccination record if obtained from local pharmacy or Health Dept. Verbalized acceptance and understanding.  Covid-19 vaccine status: Information provided on how to obtain vaccines.   Qualifies for Shingles Vaccine? Yes   Zostavax completed No   Shingrix Completed?: No.    Education has been provided regarding the importance of this vaccine. Patient has been advised to call insurance company to determine out of pocket expense if they have not yet received this vaccine. Advised may also receive vaccine at local pharmacy or Health Dept. Verbalized acceptance and understanding.  Screening Tests Health Maintenance  Topic Date Due   COVID-19 Vaccine (1) Never done   Pneumonia Vaccine 83+ Years old (1 of 2 - PCV) Never done   FOOT EXAM  Never done   OPHTHALMOLOGY EXAM  Never done   DTaP/Tdap/Td (1 - Tdap) Never done   Colonoscopy  Never done   Zoster Vaccines- Shingrix (1 of 2) Never done   DEXA SCAN  Never done   MAMMOGRAM  07/01/2023   INFLUENZA VACCINE  08/01/2023   HEMOGLOBIN A1C  10/09/2023   Diabetic kidney evaluation - Urine ACR  04/08/2024   Diabetic kidney evaluation - eGFR measurement  05/22/2024    Medicare Annual Wellness (AWV)  05/29/2024   Hepatitis C Screening  Completed   HPV VACCINES  Aged Out    Health Maintenance  Health Maintenance Due  Topic Date Due   COVID-19 Vaccine (1) Never done   Pneumonia Vaccine 68+ Years old (1 of 2 - PCV) Never done   FOOT EXAM  Never done   OPHTHALMOLOGY EXAM  Never done   DTaP/Tdap/Td (1 - Tdap) Never done   Colonoscopy  Never done   Zoster Vaccines- Shingrix (1 of 2) Never done   DEXA SCAN  Never done     Mammogram status: Completed 06/30/21. Repeat every year  Bone Density status: Ordered  . Pt provided with contact info and advised to call to schedule appt.  Lung Cancer Screening: (Low Dose CT Chest recommended if Age 53-80 years, 30 pack-year currently smoking OR have quit w/in 15years.) does qualify.   Lung Cancer Screening Referral: yes  Additional Screening:  Hepatitis C Screening: does qualify; Completed 04/09/23  Vision Screening: Recommended annual ophthalmology exams for early detection of glaucoma and other disorders of the eye. Is the patient up to date with their annual eye exam?  No  Who is the provider or what is the name of the office in which the patient attends annual eye exams? N/a If pt is not established with a provider, would they like to be referred to a provider to establish care? No .   Dental Screening: Recommended annual dental exams for proper oral hygiene  Community Resource Referral / Chronic Care Management: CRR required this visit?  No   CCM required this visit?  No      Plan:     I have personally reviewed and noted the following in the patient's chart:   Medical and social history Use of alcohol, tobacco or illicit drugs  Current medications and supplements including opioid prescriptions. Patient is not currently taking opioid prescriptions. Functional ability and status  Nutritional status Physical activity Advanced directives List of other physicians Hospitalizations, surgeries,  and ER visits in previous 12 months Vitals Screenings to include cognitive, depression, and falls Referrals and appointments  In addition, I have reviewed and discussed with patient certain preventive protocols, quality metrics, and best practice recommendations. A written personalized care plan for preventive services as well as general preventive health recommendations were provided to patient.     Jasper Riling, CMA   05/30/2023

## 2023-06-03 ENCOUNTER — Ambulatory Visit (HOSPITAL_COMMUNITY)
Admission: RE | Admit: 2023-06-03 | Discharge: 2023-06-03 | Disposition: A | Discharge status: Home / Self Care | Payer: Medicare PPO | Source: Ambulatory Visit | Attending: Internal Medicine | Admitting: Internal Medicine

## 2023-06-03 DIAGNOSIS — Z1231 Encounter for screening mammogram for malignant neoplasm of breast: Secondary | ICD-10-CM

## 2023-06-04 ENCOUNTER — Other Ambulatory Visit (HOSPITAL_COMMUNITY): Payer: Self-pay | Admitting: Internal Medicine

## 2023-06-04 DIAGNOSIS — R928 Other abnormal and inconclusive findings on diagnostic imaging of breast: Secondary | ICD-10-CM

## 2023-06-06 ENCOUNTER — Encounter (HOSPITAL_COMMUNITY): Payer: Self-pay

## 2023-06-06 ENCOUNTER — Ambulatory Visit (HOSPITAL_COMMUNITY)
Admission: RE | Admit: 2023-06-06 | Discharge: 2023-06-06 | Disposition: A | Discharge status: Home / Self Care | Payer: Medicare PPO | Source: Ambulatory Visit | Attending: Internal Medicine | Admitting: Internal Medicine

## 2023-06-06 DIAGNOSIS — R921 Mammographic calcification found on diagnostic imaging of breast: Secondary | ICD-10-CM | POA: Diagnosis not present

## 2023-06-06 DIAGNOSIS — R928 Other abnormal and inconclusive findings on diagnostic imaging of breast: Secondary | ICD-10-CM | POA: Insufficient documentation

## 2023-06-14 DIAGNOSIS — Z7982 Long term (current) use of aspirin: Secondary | ICD-10-CM | POA: Diagnosis not present

## 2023-06-14 DIAGNOSIS — M1990 Unspecified osteoarthritis, unspecified site: Secondary | ICD-10-CM | POA: Diagnosis not present

## 2023-06-14 DIAGNOSIS — I129 Hypertensive chronic kidney disease with stage 1 through stage 4 chronic kidney disease, or unspecified chronic kidney disease: Secondary | ICD-10-CM | POA: Diagnosis not present

## 2023-06-14 DIAGNOSIS — Z6832 Body mass index (BMI) 32.0-32.9, adult: Secondary | ICD-10-CM | POA: Diagnosis not present

## 2023-06-14 DIAGNOSIS — E669 Obesity, unspecified: Secondary | ICD-10-CM | POA: Diagnosis not present

## 2023-06-14 DIAGNOSIS — N1831 Chronic kidney disease, stage 3a: Secondary | ICD-10-CM | POA: Diagnosis not present

## 2023-06-14 DIAGNOSIS — E785 Hyperlipidemia, unspecified: Secondary | ICD-10-CM | POA: Diagnosis not present

## 2023-06-27 ENCOUNTER — Other Ambulatory Visit: Payer: Self-pay | Admitting: Internal Medicine

## 2023-06-27 DIAGNOSIS — I10 Essential (primary) hypertension: Secondary | ICD-10-CM

## 2023-07-11 DIAGNOSIS — E113391 Type 2 diabetes mellitus with moderate nonproliferative diabetic retinopathy without macular edema, right eye: Secondary | ICD-10-CM | POA: Diagnosis not present

## 2023-07-11 DIAGNOSIS — H01001 Unspecified blepharitis right upper eyelid: Secondary | ICD-10-CM | POA: Diagnosis not present

## 2023-07-11 DIAGNOSIS — H01004 Unspecified blepharitis left upper eyelid: Secondary | ICD-10-CM | POA: Diagnosis not present

## 2023-07-11 DIAGNOSIS — H01002 Unspecified blepharitis right lower eyelid: Secondary | ICD-10-CM | POA: Diagnosis not present

## 2023-07-22 ENCOUNTER — Other Ambulatory Visit: Payer: Self-pay | Admitting: Internal Medicine

## 2023-07-22 DIAGNOSIS — N1831 Chronic kidney disease, stage 3a: Secondary | ICD-10-CM

## 2023-08-22 ENCOUNTER — Ambulatory Visit: Payer: Medicare PPO | Admitting: Internal Medicine

## 2023-08-22 ENCOUNTER — Encounter: Payer: Self-pay | Admitting: Internal Medicine

## 2023-08-22 VITALS — BP 104/70 | HR 73 | Ht 60.0 in | Wt 160.2 lb

## 2023-08-22 DIAGNOSIS — E785 Hyperlipidemia, unspecified: Secondary | ICD-10-CM

## 2023-08-22 DIAGNOSIS — M1711 Unilateral primary osteoarthritis, right knee: Secondary | ICD-10-CM | POA: Diagnosis not present

## 2023-08-22 DIAGNOSIS — E1169 Type 2 diabetes mellitus with other specified complication: Secondary | ICD-10-CM

## 2023-08-22 DIAGNOSIS — E1122 Type 2 diabetes mellitus with diabetic chronic kidney disease: Secondary | ICD-10-CM

## 2023-08-22 DIAGNOSIS — Z7984 Long term (current) use of oral hypoglycemic drugs: Secondary | ICD-10-CM | POA: Diagnosis not present

## 2023-08-22 DIAGNOSIS — N1831 Chronic kidney disease, stage 3a: Secondary | ICD-10-CM | POA: Diagnosis not present

## 2023-08-22 DIAGNOSIS — I10 Essential (primary) hypertension: Secondary | ICD-10-CM

## 2023-08-22 NOTE — Assessment & Plan Note (Signed)
 Remains adequately controlled on current antihypertensive regimen.  No medication changes are indicated today.

## 2023-08-22 NOTE — Assessment & Plan Note (Signed)
She endorses chronic right knee pain.  X-rays from July 2023 show tricompartmental arthritis.  She has previously received intra-articular corticosteroid injections and is potentially interested in future injections. -Orthopedic surgery referral placed at patient's request

## 2023-08-22 NOTE — Assessment & Plan Note (Signed)
Renal function stable on labs from May.  She is currently on ARB and SGLT2 therapy.

## 2023-08-22 NOTE — Progress Notes (Signed)
Established Patient Office Visit  Subjective   Patient ID: Carla Webb, female    DOB: [birth year]  Age: 73 y.o. MRN: [id number]  Chief Complaint  Patient presents with   Hypertension    Follow up   Carla Webb returns to care today for routine follow-up.  She was last evaluated by me on 5/23 for follow-up of HTN and diabetes mellitus.  Atorvastatin was discontinued in favor of rosuvastatin at that time due to adverse side effects.  Repeat labs were ordered and 36-month follow-up was arranged.  There have been no acute interval events.  Carla Webb reports feeling well today.  She is asymptomatic and has no acute concerns discussed.  She has lost 10 pounds since her last appointment through dietary changes.  She has not experienced any adverse side effects since starting rosuvastatin.  Review of Systems  Constitutional:  Negative for chills and fever.  HENT:  Negative for sore throat.   Respiratory:  Negative for cough and shortness of breath.   Cardiovascular:  Negative for chest pain, palpitations and leg swelling.  Gastrointestinal:  Negative for abdominal pain, blood in stool, constipation, diarrhea, nausea and vomiting.  Genitourinary:  Negative for dysuria and hematuria.  Musculoskeletal:  Negative for myalgias.  Skin:  Negative for itching and rash.  Neurological:  Negative for dizziness and headaches.  Psychiatric/Behavioral:  Negative for depression and suicidal ideas.      Objective:     BP 104/70   Pulse 73   Ht 5' (1.524 m)   Wt 160 lb 3.2 oz (72.7 kg)   SpO2 98%   BMI 31.29 kg/m  BP Readings from Last 3 Encounters:  08/22/23 104/70  05/23/23 107/61  04/25/23 138/82   Physical Exam Vitals reviewed.  Constitutional:      General: She is not in acute distress.    Appearance: Normal appearance. She is obese. She is not toxic-appearing.  HENT:     Head: Normocephalic and atraumatic.     Right Ear: External ear normal.     Left Ear: External ear  normal.     Nose: Nose normal. No congestion or rhinorrhea.     Mouth/Throat:     Mouth: Mucous membranes are moist.     Pharynx: Oropharynx is clear. No oropharyngeal exudate or posterior oropharyngeal erythema.  Eyes:     General: No scleral icterus.    Extraocular Movements: Extraocular movements intact.     Conjunctiva/sclera: Conjunctivae normal.     Pupils: Pupils are equal, round, and reactive to light.  Cardiovascular:     Rate and Rhythm: Normal rate and regular rhythm.     Pulses: Normal pulses.     Heart sounds: Normal heart sounds. No murmur heard.    No friction rub. No gallop.  Pulmonary:     Effort: Pulmonary effort is normal.     Breath sounds: Normal breath sounds. No wheezing, rhonchi or rales.  Abdominal:     General: Abdomen is flat. Bowel sounds are normal. There is no distension.     Palpations: Abdomen is soft.     Tenderness: There is no abdominal tenderness.  Musculoskeletal:        General: No swelling. Normal range of motion.     Cervical back: Normal range of motion.     Right lower leg: No edema.     Left lower leg: No edema.  Lymphadenopathy:     Cervical: No cervical adenopathy.  Skin:    General: Skin is warm  and dry.     Capillary Refill: Capillary refill takes less than 2 seconds.     Coloration: Skin is not jaundiced.  Neurological:     General: No focal deficit present.     Mental Status: She is alert and oriented to person, place, and time.  Psychiatric:        Mood and Affect: Mood normal.        Behavior: Behavior normal.    Diabetic foot exam was performed.  No deformities or other abnormal visual findings.  Posterior tibialis and dorsalis pulse intact bilaterally.  Intact to touch and monofilament testing bilaterally.   Last CBC Lab Results  Component Value Date   WBC 6.0 04/09/2023   HGB 13.8 04/09/2023   HCT 40.5 04/09/2023   MCV 94 04/09/2023   MCH 32.1 04/09/2023   RDW 12.2 04/09/2023   PLT 218 04/09/2023   Last  metabolic panel Lab Results  Component Value Date   GLUCOSE 146 (H) 05/23/2023   NA 141 05/23/2023   K 3.6 05/23/2023   CL 101 05/23/2023   CO2 24 05/23/2023   BUN 25 05/23/2023   CREATININE 1.37 (H) 05/23/2023   EGFR 42 (L) 05/23/2023   CALCIUM 10.0 05/23/2023   PROT 8.4 05/23/2023   ALBUMIN 4.5 05/23/2023   LABGLOB 3.9 05/23/2023   AGRATIO 1.2 05/23/2023   BILITOT 0.3 05/23/2023   ALKPHOS 77 05/23/2023   AST 18 05/23/2023   ALT 18 05/23/2023   ANIONGAP 13 09/09/2014   Last lipids Lab Results  Component Value Date   CHOL 212 (H) 04/09/2023   HDL 64 04/09/2023   LDLCALC 134 (H) 04/09/2023   TRIG 81 04/09/2023   CHOLHDL 3.3 04/09/2023   Last hemoglobin A1c Lab Results  Component Value Date   HGBA1C 6.6 (H) 04/09/2023   Last thyroid functions Lab Results  Component Value Date   TSH 2.340 04/09/2023   Last vitamin D Lab Results  Component Value Date   VD25OH 32.1 04/09/2023   Last vitamin B12 and Folate Lab Results  Component Value Date   VITAMINB12 563 04/09/2023   FOLATE 5.2 04/09/2023   The 10-year ASCVD risk score (Arnett DK, et al., 2019) is: 24.4%    Assessment & Plan:   Problem List Items Addressed This Visit       Essential hypertension    Remains adequately controlled on current antihypertensive regimen.  No medication changes are indicated today.      Type 2 diabetes mellitus (HCC)    A1c 6.6 in April.  She is currently prescribed Jardiance 10 mg daily.  She has made dietary changes since her last appointment and has lost 10 pounds. -No medication changes today -Repeat A1c at follow-up in 3 months -Diabetic foot exam completed today      Hyperlipidemia associated with type 2 diabetes mellitus (HCC)    Lipid panel updated in April.  Total cholesterol 212 and LDL 134.  Her 10-year ASCVD risk remains elevated at 24.4%.  Rosuvastatin 20 mg daily was started at her last appointment as atorvastatin was discontinued in the setting of heart  palpitations.  She has not experienced any adverse side effects since starting rosuvastatin. -Repeat lipid panel at follow-up in 3 months      Osteoarthritis of right knee - Primary    She endorses chronic right knee pain.  X-rays from July 2023 show tricompartmental arthritis.  She has previously received intra-articular corticosteroid injections and is potentially interested in future injections. -Orthopedic surgery  referral placed at patient's request      CKD (chronic kidney disease) stage 3, GFR 30-59 ml/min (HCC)    Renal function stable on labs from May.  She is currently on ARB and SGLT2 therapy.       Return in about 3 months (around 11/22/2023).    Billie Lade, MD

## 2023-08-22 NOTE — Assessment & Plan Note (Signed)
A1c 6.6 in April.  She is currently prescribed Jardiance 10 mg daily.  She has made dietary changes since her last appointment and has lost 10 pounds. -No medication changes today -Repeat A1c at follow-up in 3 months -Diabetic foot exam completed today

## 2023-08-22 NOTE — Patient Instructions (Signed)
It was a pleasure to see you today.  Thank you for giving [place] the opportunity to be involved in your care.  Below is a brief recap of your visit and next steps.  We will plan to see you again in 3 months.  Summary No medication changes today. I am glad to see that things are going well. We will follow up in 3 months and repeat labs at that time  Recommend vaccinations: -Flu shot -Shingles vaccines -PCV-20 -RSV

## 2023-08-22 NOTE — Assessment & Plan Note (Signed)
Lipid panel updated in April.  Total cholesterol 212 and LDL 134.  Her 10-year ASCVD risk remains elevated at 24.4%.  Rosuvastatin 20 mg daily was started at her last appointment as atorvastatin was discontinued in the setting of heart palpitations.  She has not experienced any adverse side effects since starting rosuvastatin. -Repeat lipid panel at follow-up in 3 months

## 2023-09-04 DIAGNOSIS — Z20822 Contact with and (suspected) exposure to covid-19: Secondary | ICD-10-CM | POA: Diagnosis not present

## 2023-09-05 DIAGNOSIS — Z20822 Contact with and (suspected) exposure to covid-19: Secondary | ICD-10-CM | POA: Diagnosis not present

## 2023-09-05 DIAGNOSIS — J00 Acute nasopharyngitis [common cold]: Secondary | ICD-10-CM | POA: Diagnosis not present

## 2023-09-11 ENCOUNTER — Ambulatory Visit (INDEPENDENT_AMBULATORY_CARE_PROVIDER_SITE_OTHER): Payer: Medicare PPO | Admitting: Orthopaedic Surgery

## 2023-09-11 ENCOUNTER — Encounter: Payer: Self-pay | Admitting: Orthopaedic Surgery

## 2023-09-11 VITALS — Ht 60.0 in | Wt 150.0 lb

## 2023-09-11 DIAGNOSIS — M1711 Unilateral primary osteoarthritis, right knee: Secondary | ICD-10-CM

## 2023-09-11 MED ORDER — METHYLPREDNISOLONE ACETATE 40 MG/ML IJ SUSP
40.0000 mg | INTRAMUSCULAR | Status: AC | PRN
Start: 2023-09-11 — End: 2023-09-11
  Administered 2023-09-11: 40 mg via INTRA_ARTICULAR

## 2023-09-11 MED ORDER — BUPIVACAINE HCL 0.25 % IJ SOLN
4.0000 mL | INTRAMUSCULAR | Status: AC | PRN
Start: 2023-09-11 — End: 2023-09-11
  Administered 2023-09-11: 4 mL via INTRA_ARTICULAR

## 2023-09-11 MED ORDER — LIDOCAINE HCL 1 % IJ SOLN
0.5000 mL | INTRAMUSCULAR | Status: AC | PRN
Start: 2023-09-11 — End: 2023-09-11
  Administered 2023-09-11: .5 mL

## 2023-09-11 NOTE — Progress Notes (Signed)
Office Visit Note   Patient: Carla Webb           Date of Birth: 1950/05/10           MRN: [id number] Visit Date: 09/11/2023              Requested by: Billie Lade, MD [address] [place],  [place] [address] PCP: Billie Lade, MD   Assessment & Plan: Visit Diagnoses:  1. Primary osteoarthritis of right knee     Plan: Right knee injection performed recheck 1 month.  We reviewed x-rays.  If she is having continued pain on return we will repeat standing [facility] both knees and lateral right knee x-ray.  Follow-Up Instructions: Return in about 1 month (around 10/11/2023).   Orders:  Orders Placed This Encounter  Procedures   Large Joint Inj   No orders of the defined types were placed in this encounter.     Procedures: Large Joint Inj: R knee on 09/11/2023 10:21 AM Indications: pain and joint swelling Details: 22 G 1.5 in needle, anterolateral approach  Arthrogram: No  Medications: 40 mg methylPREDNISolone acetate 40 MG/ML; 0.5 mL lidocaine 1 %; 4 mL bupivacaine 0.25 % Outcome: tolerated well, no immediate complications Procedure, treatment alternatives, risks and benefits explained, specific risks discussed. Consent was given by the patient. Immediately prior to procedure a time out was called to verify the correct patient, procedure, equipment, support staff and site/side marked as required. Patient was prepped and draped in the usual sterile fashion.       Clinical Data: No additional findings.   Subjective: Chief Complaint  Patient presents with   Right Knee - Pain    R/ it hurts especially when walking a lot. I was seen years ago and got an injection that really helped then. I would like to try one again. I haven't had any recent xrays done.    HPI 73 year old female bilateral knee osteoarthritis worse on the right knee than left knee with some progressive mild genu varum.  Medial osteophytes bone-on-bone changes.  She has had pain for several  years.  She uses Tylenol arthritis ice she has noted she has been limping more.  She has had previous injection July 2023 which gave her several months of relief.  This was done on the right knee.  Review of Systems history of stage III kidney disease hypertension type 2 diabetes with A1c always less than 7.  Hyperlipidemia.  Positive tobacco use.   Objective: Vital Signs: Ht 5' (1.524 m)   Wt 150 lb (68 kg)   BMI 29.29 kg/m   Physical Exam Constitutional:      Appearance: She is well-developed.  HENT:     Head: Normocephalic.     Right Ear: External ear normal.     Left Ear: External ear normal. There is no impacted cerumen.  Eyes:     Pupils: Pupils are equal, round, and reactive to light.  Neck:     Thyroid: No thyromegaly.     Trachea: No tracheal deviation.  Cardiovascular:     Rate and Rhythm: Normal rate.  Pulmonary:     Effort: Pulmonary effort is normal.  Abdominal:     Palpations: Abdomen is soft.  Musculoskeletal:     Cervical back: No rigidity.  Skin:    General: Skin is warm and dry.  Neurological:     Mental Status: She is alert and oriented to person, place, and time.  Psychiatric:  Behavior: Behavior normal.     Ortho Exam negative logroll hip she is amatory with a right knee limp mild genu varum bilateral.  Medial palpable osteophytes and medial tenderness crepitus with knee range of motion she does reach full extension flexes to 115 degrees.  Specialty Comments:  No specialty comments available.  Imaging: No results found.   PMFS History: Patient Active Problem List   Diagnosis Date Noted   Breast cancer screening by mammogram 05/23/2023   CKD (chronic kidney disease) stage 3, GFR 30-59 ml/min (HCC) 05/01/2023   Hyperlipidemia associated with type 2 diabetes mellitus (HCC) 05/01/2023   Current tobacco use 04/15/2023   Type 2 diabetes mellitus (HCC) 04/15/2023   Encounter for general adult medical examination with abnormal findings  04/15/2023   Osteoarthritis of right knee 10/09/2007   Essential hypertension 10/07/2007   Past Medical History:  Diagnosis Date   Hypertension     No family history on file.  Past Surgical History:  Procedure Laterality Date   CATARACT EXTRACTION W/PHACO Right 07/24/2021   Procedure: CATARACT EXTRACTION PHACO AND INTRAOCULAR LENS PLACEMENT (IOC);  Surgeon: Fabio Pierce, MD;  Location: [facility];  Service: Ophthalmology;  Laterality: Right;  CDE  8.98   CATARACT EXTRACTION W/PHACO Left 08/07/2021   Procedure: CATARACT EXTRACTION PHACO AND INTRAOCULAR LENS PLACEMENT LEFT EYE;  Surgeon: Fabio Pierce, MD;  Location: [facility];  Service: Ophthalmology;  Laterality: Left;  CDE   8.32   Social History   Occupational History   Not on file  Tobacco Use   Smoking status: Every Day    Current packs/day: 0.25    Average packs/day: 0.3 packs/day for 30.0 years (7.5 ttl pk-yrs)    Types: Cigarettes   Smokeless tobacco: Not on file   Tobacco comments:    Working on quitting, smoking about 2 cigarettes a day now.  Substance and Sexual Activity   Alcohol use: No   Drug use: No   Sexual activity: Not on file

## 2023-10-09 ENCOUNTER — Ambulatory Visit: Payer: Medicare PPO | Admitting: Orthopaedic Surgery

## 2023-10-16 ENCOUNTER — Other Ambulatory Visit: Payer: Self-pay | Admitting: Internal Medicine

## 2023-10-16 DIAGNOSIS — I10 Essential (primary) hypertension: Secondary | ICD-10-CM

## 2023-10-16 DIAGNOSIS — E1122 Type 2 diabetes mellitus with diabetic chronic kidney disease: Secondary | ICD-10-CM

## 2023-10-16 DIAGNOSIS — N1831 Chronic kidney disease, stage 3a: Secondary | ICD-10-CM

## 2023-11-26 ENCOUNTER — Ambulatory Visit: Payer: Medicare PPO | Admitting: Internal Medicine

## 2023-12-13 ENCOUNTER — Ambulatory Visit: Payer: Medicare PPO | Admitting: Internal Medicine

## 2023-12-13 ENCOUNTER — Encounter: Payer: Self-pay | Admitting: Internal Medicine

## 2023-12-13 VITALS — BP 136/70 | HR 70 | Ht 60.0 in | Wt 152.2 lb

## 2023-12-13 DIAGNOSIS — I10 Essential (primary) hypertension: Secondary | ICD-10-CM | POA: Diagnosis not present

## 2023-12-13 DIAGNOSIS — E1169 Type 2 diabetes mellitus with other specified complication: Secondary | ICD-10-CM | POA: Diagnosis not present

## 2023-12-13 DIAGNOSIS — N1831 Chronic kidney disease, stage 3a: Secondary | ICD-10-CM | POA: Diagnosis not present

## 2023-12-13 DIAGNOSIS — E785 Hyperlipidemia, unspecified: Secondary | ICD-10-CM | POA: Diagnosis not present

## 2023-12-13 DIAGNOSIS — Z7984 Long term (current) use of oral hypoglycemic drugs: Secondary | ICD-10-CM | POA: Diagnosis not present

## 2023-12-13 DIAGNOSIS — E1122 Type 2 diabetes mellitus with diabetic chronic kidney disease: Secondary | ICD-10-CM | POA: Diagnosis not present

## 2023-12-13 MED ORDER — HYDROCHLOROTHIAZIDE 25 MG PO TABS
25.0000 mg | ORAL_TABLET | Freq: Every day | ORAL | 3 refills | Status: AC
Start: 2023-12-13 — End: ?

## 2023-12-13 NOTE — Progress Notes (Signed)
Established Patient Office Visit  Subjective   Patient ID: Carla Webb, female    DOB: 06-15-1950  Age: 73 y.o. MRN: [id number]  Chief Complaint  Patient presents with   Hypertension    Follow up    Carla Webb returns to care today for routine follow-up.  She was last evaluated by me on 8/22.  No medication changes were made at that time and she was referred to orthopedic surgery in the setting of chronic right knee pain with known osteoarthritis of the right knee.  In the interim, she was evaluated by Dr. Ophelia Charter (orthopedic surgery) on 9/11.  She received an intra-articular corticosteroid injection in the right knee.  There have otherwise been no acute interval events.  Ms. Backs reports feeling well today.  Right knee pain has improved since receiving a steroid injection.  She does not have any acute concerns to discuss today.  Past Medical History:  Diagnosis Date   Hypertension    Past Surgical History:  Procedure Laterality Date   CATARACT EXTRACTION W/PHACO Right 07/24/2021   Procedure: CATARACT EXTRACTION PHACO AND INTRAOCULAR LENS PLACEMENT (IOC);  Surgeon: Fabio Pierce, MD;  Location: [facility];  Service: Ophthalmology;  Laterality: Right;  CDE  8.98   CATARACT EXTRACTION W/PHACO Left 08/07/2021   Procedure: CATARACT EXTRACTION PHACO AND INTRAOCULAR LENS PLACEMENT LEFT EYE;  Surgeon: Fabio Pierce, MD;  Location: [facility];  Service: Ophthalmology;  Laterality: Left;  CDE   8.32   Social History   Tobacco Use   Smoking status: Every Day    Current packs/day: 0.25    Average packs/day: 0.3 packs/day for 30.0 years (7.5 ttl pk-yrs)    Types: Cigarettes   Tobacco comments:    Working on quitting, smoking about 2 cigarettes a day now.  Substance Use Topics   Alcohol use: No   Drug use: No   History reviewed. No pertinent family history. No Known Allergies  Review of Systems  Constitutional:  Negative for chills and fever.  HENT:  Negative for sore throat.    Respiratory:  Negative for cough and shortness of breath.   Cardiovascular:  Negative for chest pain, palpitations and leg swelling.  Gastrointestinal:  Negative for abdominal pain, blood in stool, constipation, diarrhea, nausea and vomiting.  Genitourinary:  Negative for dysuria and hematuria.  Musculoskeletal:  Negative for myalgias.  Skin:  Negative for itching and rash.  Neurological:  Negative for dizziness and headaches.  Psychiatric/Behavioral:  Negative for depression and suicidal ideas.      Objective:     BP 136/70   Pulse 70   Ht 5' (1.524 m)   Wt 152 lb 3.2 oz (69 kg)   SpO2 98%   BMI 29.72 kg/m  BP Readings from Last 3 Encounters:  12/13/23 136/70  08/22/23 104/70  05/23/23 107/61   Physical Exam Vitals reviewed.  Constitutional:      General: She is not in acute distress.    Appearance: Normal appearance. She is not toxic-appearing.  HENT:     Head: Normocephalic and atraumatic.     Right Ear: External ear normal.     Left Ear: External ear normal.     Nose: Nose normal. No congestion or rhinorrhea.     Mouth/Throat:     Mouth: Mucous membranes are moist.     Pharynx: Oropharynx is clear. No oropharyngeal exudate or posterior oropharyngeal erythema.  Eyes:     General: No scleral icterus.    Extraocular Movements: Extraocular movements intact.  Conjunctiva/sclera: Conjunctivae normal.     Pupils: Pupils are equal, round, and reactive to light.  Cardiovascular:     Rate and Rhythm: Normal rate and regular rhythm.     Pulses: Normal pulses.     Heart sounds: Normal heart sounds. No murmur heard.    No friction rub. No gallop.  Pulmonary:     Effort: Pulmonary effort is normal.     Breath sounds: Normal breath sounds. No wheezing, rhonchi or rales.  Abdominal:     General: Abdomen is flat. Bowel sounds are normal. There is no distension.     Palpations: Abdomen is soft.     Tenderness: There is no abdominal tenderness.  Musculoskeletal:         General: No swelling. Normal range of motion.     Cervical back: Normal range of motion.     Right lower leg: No edema.     Left lower leg: No edema.  Lymphadenopathy:     Cervical: No cervical adenopathy.  Skin:    General: Skin is warm and dry.     Capillary Refill: Capillary refill takes less than 2 seconds.     Coloration: Skin is not jaundiced.  Neurological:     General: No focal deficit present.     Mental Status: She is alert and oriented to person, place, and time.  Psychiatric:        Mood and Affect: Mood normal.        Behavior: Behavior normal.   Last CBC Lab Results  Component Value Date   WBC 6.0 04/09/2023   HGB 13.8 04/09/2023   HCT 40.5 04/09/2023   MCV 94 04/09/2023   MCH 32.1 04/09/2023   RDW 12.2 04/09/2023   PLT 218 04/09/2023   Last metabolic panel Lab Results  Component Value Date   GLUCOSE 146 (H) 05/23/2023   NA 141 05/23/2023   K 3.6 05/23/2023   CL 101 05/23/2023   CO2 24 05/23/2023   BUN 25 05/23/2023   CREATININE 1.37 (H) 05/23/2023   EGFR 42 (L) 05/23/2023   CALCIUM 10.0 05/23/2023   PROT 8.4 05/23/2023   ALBUMIN 4.5 05/23/2023   LABGLOB 3.9 05/23/2023   AGRATIO 1.2 05/23/2023   BILITOT 0.3 05/23/2023   ALKPHOS 77 05/23/2023   AST 18 05/23/2023   ALT 18 05/23/2023   ANIONGAP 13 09/09/2014   Last lipids Lab Results  Component Value Date   CHOL 212 (H) 04/09/2023   HDL 64 04/09/2023   LDLCALC 134 (H) 04/09/2023   TRIG 81 04/09/2023   CHOLHDL 3.3 04/09/2023   Last hemoglobin A1c Lab Results  Component Value Date   HGBA1C 6.6 (H) 04/09/2023   Last thyroid functions Lab Results  Component Value Date   TSH 2.340 04/09/2023   Last vitamin D Lab Results  Component Value Date   VD25OH 32.1 04/09/2023   Last vitamin B12 and Folate Lab Results  Component Value Date   VITAMINB12 563 04/09/2023   FOLATE 5.2 04/09/2023   The 10-year ASCVD risk score (Arnett DK, et al., 2019) is: 40.9%    Assessment & Plan:   Problem  List Items Addressed This Visit       Essential hypertension - Primary   BP is acceptable today at 136/70.  Her currently prescribed antihypertensive regimen includes amlodipine 5 mg daily, olmesartan 20 mg daily, and HCTZ 25 mg daily.  She reports that she inadvertently stopped taking olmesartan recently. -Resume olmesartan as previously prescribed.  Continue HCTZ  and amlodipine.      Type 2 diabetes mellitus (HCC)   A1c 6.6 on labs from April.  She is currently prescribed Jardiance 10 mg daily. -Repeat A1c ordered today      Hyperlipidemia associated with type 2 diabetes mellitus (HCC)   Lipid panel updated in April.  Total cholesterol 212 and LDL 134.  Rosuvastatin 20 mg daily was started previously and atorvastatin was discontinued in the setting of heart palpitations.  She has not experienced any adverse side effects since starting rosuvastatin. -Repeat lipid panel ordered today      CKD (chronic kidney disease) stage 3, GFR 30-59 ml/min (HCC)   Stable on labs from May.  Currently on Jardiance.  She inadvertently stopped taking olmesartan recently but was instructed to resume today. -Repeat labs ordered today       Return in about 4 months (around 04/12/2024).    Billie Lade, MD

## 2023-12-13 NOTE — Assessment & Plan Note (Signed)
A1c 6.6 on labs from April.  She is currently prescribed Jardiance 10 mg daily. -Repeat A1c ordered today

## 2023-12-13 NOTE — Patient Instructions (Signed)
It was a pleasure to see you today.  Thank you for giving [place] the opportunity to be involved in your care.  Below is a brief recap of your visit and next steps.  We will plan to see you again in 4 months.  Summary Resume olmesartan 20 mg daily No medication changes today Repeat labs ordered Follow up in 4 months

## 2023-12-13 NOTE — Assessment & Plan Note (Signed)
Stable on labs from May.  Currently on Jardiance.  She inadvertently stopped taking olmesartan recently but was instructed to resume today. -Repeat labs ordered today

## 2023-12-13 NOTE — Assessment & Plan Note (Signed)
Lipid panel updated in April.  Total cholesterol 212 and LDL 134.  Rosuvastatin 20 mg daily was started previously and atorvastatin was discontinued in the setting of heart palpitations.  She has not experienced any adverse side effects since starting rosuvastatin. -Repeat lipid panel ordered today

## 2023-12-13 NOTE — Assessment & Plan Note (Signed)
BP is acceptable today at 136/70.  Her currently prescribed antihypertensive regimen includes amlodipine 5 mg daily, olmesartan 20 mg daily, and HCTZ 25 mg daily.  She reports that she inadvertently stopped taking olmesartan recently. -Resume olmesartan as previously prescribed.  Continue HCTZ and amlodipine.

## 2023-12-14 LAB — CBC WITH DIFFERENTIAL/PLATELET
Basophils Absolute: 0.1 10*3/uL (ref 0.0–0.2)
Basos: 1 %
EOS (ABSOLUTE): 0.3 10*3/uL (ref 0.0–0.4)
Eos: 5 %
Hematocrit: 38.8 % (ref 34.0–46.6)
Hemoglobin: 12.7 g/dL (ref 11.1–15.9)
Immature Grans (Abs): 0 10*3/uL (ref 0.0–0.1)
Immature Granulocytes: 0 %
Lymphocytes Absolute: 2.6 10*3/uL (ref 0.7–3.1)
Lymphs: 44 %
MCH: 31.6 pg (ref 26.6–33.0)
MCHC: 32.7 g/dL (ref 31.5–35.7)
MCV: 97 fL (ref 79–97)
Monocytes Absolute: 0.3 10*3/uL (ref 0.1–0.9)
Monocytes: 6 %
Neutrophils Absolute: 2.5 10*3/uL (ref 1.4–7.0)
Neutrophils: 44 %
Platelets: 208 10*3/uL (ref 150–450)
RBC: 4.02 x10E6/uL (ref 3.77–5.28)
RDW: 12.9 % (ref 11.7–15.4)
WBC: 5.8 10*3/uL (ref 3.4–10.8)

## 2023-12-14 LAB — CMP14+EGFR
ALT: 13 [IU]/L (ref 0–32)
AST: 18 [IU]/L (ref 0–40)
Albumin: 4.2 g/dL (ref 3.8–4.8)
Alkaline Phosphatase: 79 [IU]/L (ref 44–121)
BUN/Creatinine Ratio: 11 — ABNORMAL LOW (ref 12–28)
BUN: 14 mg/dL (ref 8–27)
Bilirubin Total: 0.4 mg/dL (ref 0.0–1.2)
CO2: 23 mmol/L (ref 20–29)
Calcium: 9.8 mg/dL (ref 8.7–10.3)
Chloride: 103 mmol/L (ref 96–106)
Creatinine, Ser: 1.25 mg/dL — ABNORMAL HIGH (ref 0.57–1.00)
Globulin, Total: 3.7 g/dL (ref 1.5–4.5)
Glucose: 95 mg/dL (ref 70–99)
Potassium: 3.4 mmol/L — ABNORMAL LOW (ref 3.5–5.2)
Sodium: 143 mmol/L (ref 134–144)
Total Protein: 7.9 g/dL (ref 6.0–8.5)
eGFR: 46 mL/min/{1.73_m2} — ABNORMAL LOW (ref >59)

## 2023-12-14 LAB — B12 AND FOLATE PANEL
Folate: 7.7 ng/mL (ref >3.0)
Vitamin B-12: 551 pg/mL (ref 232–1245)

## 2023-12-14 LAB — LIPID PANEL
Chol/HDL Ratio: 2.5 {ratio} (ref 0.0–4.4)
Cholesterol, Total: 156 mg/dL (ref 100–199)
HDL: 62 mg/dL (ref >39)
LDL Chol Calc (NIH): 76 mg/dL (ref 0–99)
Triglycerides: 98 mg/dL (ref 0–149)
VLDL Cholesterol Cal: 18 mg/dL (ref 5–40)

## 2023-12-14 LAB — TSH+FREE T4
Free T4: 1.29 ng/dL (ref 0.82–1.77)
TSH: 2.58 u[IU]/mL (ref 0.450–4.500)

## 2023-12-14 LAB — VITAMIN D 25 HYDROXY (VIT D DEFICIENCY, FRACTURES): Vit D, 25-Hydroxy: 31.2 ng/mL (ref 30.0–100.0)

## 2023-12-14 LAB — HEMOGLOBIN A1C
Est. average glucose Bld gHb Est-mCnc: 126 mg/dL
Hgb A1c MFr Bld: 6 % — ABNORMAL HIGH (ref 4.8–5.6)

## 2024-01-08 ENCOUNTER — Other Ambulatory Visit: Payer: Self-pay | Admitting: Internal Medicine

## 2024-01-08 DIAGNOSIS — N1831 Chronic kidney disease, stage 3a: Secondary | ICD-10-CM

## 2024-01-15 ENCOUNTER — Other Ambulatory Visit: Payer: Self-pay | Admitting: Internal Medicine

## 2024-01-15 DIAGNOSIS — I10 Essential (primary) hypertension: Secondary | ICD-10-CM

## 2024-02-25 ENCOUNTER — Ambulatory Visit: Payer: Self-pay | Admitting: Internal Medicine

## 2024-02-25 NOTE — Telephone Encounter (Signed)
 Copied from CRM [id number]. Topic: Clinical - Medication Question >> Feb 25, 2024  9:02 AM Everette C wrote: Reason for CRM: The patient would like to speak with a member of staff when possible about their prescriptions  The patient shares that they have began to experience significant hair loss and is concerned that it may be related to their prescriptions but they are uncertain of which prescription is causing the concern  Please contact the patient to discuss further   Chief Complaint: Hair Loss Symptoms: hair coming out Frequency: a few weeks ago Pertinent Negatives: Patient denies any recent severe illness/injury/stress, major weight loss, chemicals/perms in her hair, scalp abnormalities. Disposition: [] ED /[] Urgent Care (no appt availability in office) / [x] Appointment(In office/virtual)/ []  [facility]/ [] Home Care/ [] Refused Recommended Disposition /[] [facility]/ []  Follow-up with PCP Additional Notes: Patient called and advised that her hair is falling out and she hasn't changed any products or anything.  She states that when she had her last medication change with her PCP Dr Durwin Nora she started noticing her hair coming out.  She first noticed this a few weeks ago.  She noticed that she can scratch her head and pieces of hair would come out.  She is noticing her hair thinning out. She wears her hair natural with no perms or chemicals. She states that she did lose a few pounds after taking the medication that her PCP prescribed. Patient denies any recent severe illness/injury/stress, major weight loss, chemicals/perms in her hair, scalp abnormalities. Appointment made for 03/04/2024 with Dr Syliva Overman at 8:40 am.  Patient is also advised that if anything major changes/worsens and she is concerned she can either call us back or go to the emergency room.  Patient verbalized understanding.     Reason for Disposition  [1] Hair thinning, hair loss, or balding AND [2]  cause not known (e.g., no recent precipitating factors such as childbirth, weight loss surgery)  Answer Assessment - Initial Assessment Questions 1. LOCATION: "Where is the hair loss?" (e.g., all of scalp, parts of scalp, back of head or neck)     Around the front on both sides and on the top 2. DESCRIPTION: "Please describe it.? (e.g., thinning of hair, balding, patches of hair missing)     Thinning and pieces coming out if she scratches her head 3. ONSET: "When did the hair loss begin?" (e.g., sudden or gradual onset; days, weeks, months or years ago)     A few weeks ago 4. OTHER SYMPTOMS: "What does the scalp look like where the hair is missing?" (e.g., normal, redness, crusts, scarring)     No 5. OTHER FACTORS: "Have you had any of the following recently: childbirth, severe illness or injury, major surgery, major weight loss, cancer chemo, tight hair braids, serious stress?"     No 6. CAUSE: "What do you think is causing the hair loss?"     unknown  Protocols used: Hair Loss-A-AH

## 2024-03-04 ENCOUNTER — Ambulatory Visit: Payer: Self-pay | Admitting: Family Medicine

## 2024-03-05 ENCOUNTER — Encounter: Payer: Self-pay | Admitting: Family Medicine

## 2024-03-05 ENCOUNTER — Ambulatory Visit: Payer: Self-pay | Admitting: Family Medicine

## 2024-03-05 VITALS — BP 130/80 | HR 87 | Resp 16 | Ht 60.0 in | Wt 150.1 lb

## 2024-03-05 DIAGNOSIS — L659 Nonscarring hair loss, unspecified: Secondary | ICD-10-CM | POA: Insufficient documentation

## 2024-03-05 MED ORDER — MINOXIDIL 2 % EX SOLN: Freq: Two times a day (BID) | CUTANEOUS | 0 refills | Status: DC

## 2024-03-05 NOTE — Progress Notes (Signed)
   Established Patient Office Visit   Subjective  Patient ID: Carla Webb, female    DOB: 1950/01/13  Age: 74 y.o. MRN: [id number]  Chief Complaint  Patient presents with   Medication Problem    All of a sudden her hair has been thinning and she thinks its related to her medication. She brought her old medication that she was on before that didn't cause her any side effects to see if maybe she can go back on it    She  has a past medical history of Hypertension.  Patient presents to the clinic for hair loss concerns. For the details of today's visit, please refer to assessment and plan.       Review of Systems  Constitutional:  Negative for chills and fever.  Respiratory:  Negative for shortness of breath.   Cardiovascular:  Negative for chest pain.  Neurological:  Negative for dizziness and headaches.      Objective:     BP 130/80   Pulse 87   Resp 16   Ht 5' (1.524 m)   Wt 150 lb 1.9 oz (68.1 kg)   SpO2 95%   BMI 29.32 kg/m  BP Readings from Last 3 Encounters:  03/05/24 130/80  12/13/23 136/70  08/22/23 104/70      Physical Exam Vitals reviewed.  Constitutional:      General: She is not in acute distress.    Appearance: Normal appearance. She is not ill-appearing, toxic-appearing or diaphoretic.  HENT:     Head: Normocephalic.  Eyes:     General:        Right eye: No discharge.        Left eye: No discharge.     Conjunctiva/sclera: Conjunctivae normal.  Cardiovascular:     Rate and Rhythm: Normal rate.     Pulses: Normal pulses.     Heart sounds: Normal heart sounds.  Pulmonary:     Effort: Pulmonary effort is normal. No respiratory distress.     Breath sounds: Normal breath sounds.  Skin:    General: Skin is warm and dry.     Capillary Refill: Capillary refill takes less than 2 seconds.  Neurological:     Mental Status: She is alert.     Coordination: Coordination normal.     Gait: Gait normal.  Psychiatric:        Mood and Affect: Mood  normal.        Behavior: Behavior normal.      No results found for any visits on 03/05/24.  The 10-year ASCVD risk score (Arnett DK, et al., 2019) is: 35.9%    Assessment & Plan:  Hair loss Assessment & Plan: No alopecia noted Review labs with patient TSH levels normal, Vit D and Vit b12 levels normal Iron panel and zinc labs ordered to rule out deficiency Discussed with patient hair loss can be related to age and is quite common, especially in older adults Age-related hair thinning is gradual and often diffuse rather than patchy. Trial on minoxidil solution 2 times daily  Orders: -     Zinc -     Minoxidil; Apply topically 2 (two) times daily.  Dispense: 60 mL; Refill: 0 -     Iron, TIBC and Ferritin Panel    Return if symptoms worsen or fail to improve.   Cruzita Lederer Newman Nip, FNP

## 2024-03-05 NOTE — Assessment & Plan Note (Signed)
 No alopecia noted Review labs with patient TSH levels normal, Vit D and Vit b12 levels normal Iron panel and zinc labs ordered to rule out deficiency Discussed with patient hair loss can be related to age and is quite common, especially in older adults Age-related hair thinning is gradual and often diffuse rather than patchy. Trial on minoxidil solution 2 times daily

## 2024-03-05 NOTE — Patient Instructions (Addendum)
        Great to see you today.  I have refilled the medication(s) we provide.   Walk in for Lab work:  [facility] [address] [place], [place] [address]  [phone] Monday- Friday: 8?AM-12:30?PM, 1:30-5?PM     If labs were collected, we will inform you of lab results once received either by echart message or telephone call.   - echart message- for normal results that have been seen by the patient already.   - telephone call: abnormal results or if patient has not viewed results in their echart.   - Please take medications as prescribed. - Follow up with your primary health provider if any health concerns arises. - If symptoms worsen please contact your primary care provider and/or visit the emergency department.

## 2024-03-06 ENCOUNTER — Ambulatory Visit: Payer: Self-pay | Admitting: Professional Counselor

## 2024-04-13 ENCOUNTER — Ambulatory Visit: Payer: Medicare PPO | Admitting: Internal Medicine

## 2024-04-13 ENCOUNTER — Encounter: Payer: Self-pay | Admitting: Internal Medicine

## 2024-04-13 VITALS — BP 118/62 | HR 70 | Ht 60.0 in | Wt 150.6 lb

## 2024-04-13 DIAGNOSIS — Z7984 Long term (current) use of oral hypoglycemic drugs: Secondary | ICD-10-CM

## 2024-04-13 DIAGNOSIS — E1122 Type 2 diabetes mellitus with diabetic chronic kidney disease: Secondary | ICD-10-CM | POA: Diagnosis not present

## 2024-04-13 DIAGNOSIS — N1831 Chronic kidney disease, stage 3a: Secondary | ICD-10-CM | POA: Diagnosis not present

## 2024-04-13 DIAGNOSIS — L659 Nonscarring hair loss, unspecified: Secondary | ICD-10-CM

## 2024-04-13 DIAGNOSIS — I10 Essential (primary) hypertension: Secondary | ICD-10-CM | POA: Diagnosis not present

## 2024-04-13 DIAGNOSIS — E785 Hyperlipidemia, unspecified: Secondary | ICD-10-CM

## 2024-04-13 DIAGNOSIS — E1169 Type 2 diabetes mellitus with other specified complication: Secondary | ICD-10-CM | POA: Diagnosis not present

## 2024-04-13 MED ORDER — MINOXIDIL 2 % EX SOLN: Freq: Two times a day (BID) | CUTANEOUS | 0 refills | Status: AC

## 2024-04-13 NOTE — Progress Notes (Signed)
 Established Patient Office Visit  Subjective   Patient ID: Carla Webb, female    DOB: [birth year]  Age: 74 y.o. MRN: [id number]  Chief Complaint  Patient presents with   Care Management    Four month follow up    Carla Webb returns to care today for routine follow-up.  She was last evaluated by me in December 2024 for HTN follow-up.  Olmesartan was resumed at that time, repeat labs ordered, and 23-month follow-up arranged.  In the interim, she presented to [facility] for an acute visit on 3/6 in the setting of alopecia.  There have otherwise been no acute interval events.  Today she reports feeling well and has no acute concerns to discuss.  Past Medical History:  Diagnosis Date   Hypertension    Past Surgical History:  Procedure Laterality Date   CATARACT EXTRACTION W/PHACO Right 07/24/2021   Procedure: CATARACT EXTRACTION PHACO AND INTRAOCULAR LENS PLACEMENT (IOC);  Surgeon: Fabio Pierce, MD;  Location: [facility];  Service: Ophthalmology;  Laterality: Right;  CDE  8.98   CATARACT EXTRACTION W/PHACO Left 08/07/2021   Procedure: CATARACT EXTRACTION PHACO AND INTRAOCULAR LENS PLACEMENT LEFT EYE;  Surgeon: Fabio Pierce, MD;  Location: [facility];  Service: Ophthalmology;  Laterality: Left;  CDE   8.32   Social History   Tobacco Use   Smoking status: Every Day    Current packs/day: 0.25    Average packs/day: 0.3 packs/day for 30.0 years (7.5 ttl pk-yrs)    Types: Cigarettes   Tobacco comments:    Working on quitting, smoking about 2 cigarettes a day now.  Substance Use Topics   Alcohol use: No   Drug use: No   No family history on file. No Known Allergies  Review of Systems  Constitutional:  Negative for chills and fever.  HENT:  Negative for sore throat.   Respiratory:  Negative for cough and shortness of breath.   Cardiovascular:  Negative for chest pain, palpitations and leg swelling.  Gastrointestinal:  Negative for abdominal pain, blood in stool, constipation, diarrhea,  nausea and vomiting.  Genitourinary:  Negative for dysuria and hematuria.  Musculoskeletal:  Negative for myalgias.  Skin:  Negative for itching and rash.  Neurological:  Negative for dizziness and headaches.  Psychiatric/Behavioral:  Negative for depression and suicidal ideas.      Objective:     BP 118/62   Pulse 70   Ht 5' (1.524 m)   Wt 150 lb 9.6 oz (68.3 kg)   SpO2 99%   BMI 29.41 kg/m  BP Readings from Last 3 Encounters:  04/13/24 118/62  03/05/24 130/80  12/13/23 136/70   Physical Exam Vitals reviewed.  Constitutional:      General: She is not in acute distress.    Appearance: Normal appearance. She is not toxic-appearing.  HENT:     Head: Normocephalic and atraumatic.     Right Ear: External ear normal.     Left Ear: External ear normal.     Nose: Nose normal. No congestion or rhinorrhea.     Mouth/Throat:     Mouth: Mucous membranes are moist.     Pharynx: Oropharynx is clear. No oropharyngeal exudate or posterior oropharyngeal erythema.  Eyes:     General: No scleral icterus.    Extraocular Movements: Extraocular movements intact.     Conjunctiva/sclera: Conjunctivae normal.     Pupils: Pupils are equal, round, and reactive to light.  Cardiovascular:     Rate and Rhythm: Normal rate and regular  rhythm.     Pulses: Normal pulses.     Heart sounds: Normal heart sounds. No murmur heard.    No friction rub. No gallop.  Pulmonary:     Effort: Pulmonary effort is normal.     Breath sounds: Normal breath sounds. No wheezing, rhonchi or rales.  Abdominal:     General: Abdomen is flat. Bowel sounds are normal. There is no distension.     Palpations: Abdomen is soft.     Tenderness: There is no abdominal tenderness.  Musculoskeletal:        General: No swelling. Normal range of motion.     Cervical back: Normal range of motion.     Right lower leg: No edema.     Left lower leg: No edema.  Lymphadenopathy:     Cervical: No cervical adenopathy.  Skin:     General: Skin is warm and dry.     Capillary Refill: Capillary refill takes less than 2 seconds.     Coloration: Skin is not jaundiced.  Neurological:     General: No focal deficit present.     Mental Status: She is alert and oriented to person, place, and time.  Psychiatric:        Mood and Affect: Mood normal.        Behavior: Behavior normal.   Last CBC Lab Results  Component Value Date   WBC 5.8 12/13/2023   HGB 12.7 12/13/2023   HCT 38.8 12/13/2023   MCV 97 12/13/2023   MCH 31.6 12/13/2023   RDW 12.9 12/13/2023   PLT 208 12/13/2023   Last metabolic panel Lab Results  Component Value Date   GLUCOSE 95 12/13/2023   NA 143 12/13/2023   K 3.4 (L) 12/13/2023   CL 103 12/13/2023   CO2 23 12/13/2023   BUN 14 12/13/2023   CREATININE 1.25 (H) 12/13/2023   EGFR 46 (L) 12/13/2023   CALCIUM 9.8 12/13/2023   PROT 7.9 12/13/2023   ALBUMIN 4.2 12/13/2023   LABGLOB 3.7 12/13/2023   AGRATIO 1.2 05/23/2023   BILITOT 0.4 12/13/2023   ALKPHOS 79 12/13/2023   AST 18 12/13/2023   ALT 13 12/13/2023   ANIONGAP 13 09/09/2014   Last lipids Lab Results  Component Value Date   CHOL 156 12/13/2023   HDL 62 12/13/2023   LDLCALC 76 12/13/2023   TRIG 98 12/13/2023   CHOLHDL 2.5 12/13/2023   Last hemoglobin A1c Lab Results  Component Value Date   HGBA1C 6.0 (H) 12/13/2023   Last thyroid functions Lab Results  Component Value Date   TSH 2.580 12/13/2023   Last vitamin D Lab Results  Component Value Date   VD25OH 31.2 12/13/2023   Last vitamin B12 and Folate Lab Results  Component Value Date   VITAMINB12 551 12/13/2023   FOLATE 7.7 12/13/2023   The 10-year ASCVD risk score (Arnett DK, et al., 2019) is: 30.7%    Assessment & Plan:   Problem List Items Addressed This Visit       Essential hypertension   Remains adequately controlled on current antihypertensive regimen.  No medication changes are indicated today.      Type 2 diabetes mellitus (HCC) - Primary   A1c  6.0 on labs from December.  She is currently prescribed Jardiance 10 mg daily.  No medication changes are indicated today.  Will update urine microalbumin/creatinine ratio.      Hyperlipidemia associated with type 2 diabetes mellitus (HCC)   She is currently prescribed rosuvastatin 20  mg daily.  Lipid panel updated in December reflects adequate control on this regimen.      Alopecia of scalp   Recent acute visit in the setting of alopecia.  No alopecia noted.  Minoxidil topical solution was prescribed but she did not fill the prescription.  Will send again today.      CKD (chronic kidney disease) stage 3, GFR 30-59 ml/min (HCC)   Renal function stable on labs from December.  Currently on olmesartan and Jardiance.  No changes are indicated today.      Return in about 6 months (around 10/13/2024).   Tobi Fortes, MD

## 2024-04-13 NOTE — Assessment & Plan Note (Signed)
 Renal function stable on labs from December.  Currently on olmesartan and Jardiance.  No changes are indicated today.

## 2024-04-13 NOTE — Assessment & Plan Note (Signed)
 A1c 6.0 on labs from December.  She is currently prescribed Jardiance 10 mg daily.  No medication changes are indicated today.  Will update urine microalbumin/creatinine ratio.

## 2024-04-13 NOTE — Assessment & Plan Note (Signed)
 She is currently prescribed rosuvastatin 20 mg daily.  Lipid panel updated in December reflects adequate control on this regimen.

## 2024-04-13 NOTE — Assessment & Plan Note (Signed)
 Remains adequately controlled on current antihypertensive regimen.  No medication changes are indicated today.

## 2024-04-13 NOTE — Patient Instructions (Signed)
 It was a pleasure to see you today.  Thank you for giving us  the opportunity to be involved in your care.  Below is a brief recap of your visit and next steps.  We will plan to see you again in 6 months.  Summary No medication changes today Check urine study Follow up in 6 months

## 2024-04-13 NOTE — Assessment & Plan Note (Signed)
 Recent acute visit in the setting of alopecia.  No alopecia noted.  Minoxidil topical solution was prescribed but she did not fill the prescription.  Will send again today.

## 2024-04-15 LAB — MICROALBUMIN / CREATININE URINE RATIO
Creatinine, Urine: 101.2 mg/dL
Microalb/Creat Ratio: 48 mg/g{creat} — ABNORMAL HIGH (ref 0–29)
Microalbumin, Urine: 48.7 ug/mL

## 2024-04-17 DIAGNOSIS — Z20822 Contact with and (suspected) exposure to covid-19: Secondary | ICD-10-CM | POA: Diagnosis not present

## 2024-06-01 ENCOUNTER — Other Ambulatory Visit (INDEPENDENT_AMBULATORY_CARE_PROVIDER_SITE_OTHER)

## 2024-06-01 ENCOUNTER — Encounter: Payer: Self-pay | Admitting: Physician Assistant

## 2024-06-01 ENCOUNTER — Ambulatory Visit: Admitting: Physician Assistant

## 2024-06-01 DIAGNOSIS — M1711 Unilateral primary osteoarthritis, right knee: Secondary | ICD-10-CM

## 2024-06-01 MED ORDER — METHYLPREDNISOLONE ACETATE 40 MG/ML IJ SUSP
40.0000 mg | INTRAMUSCULAR | Status: AC | PRN
  Administered 2024-06-01: 40 mg via INTRA_ARTICULAR

## 2024-06-01 MED ORDER — LIDOCAINE HCL 1 % IJ SOLN
3.0000 mL | INTRAMUSCULAR | Status: AC | PRN
  Administered 2024-06-01: 3 mL

## 2024-06-01 NOTE — Progress Notes (Signed)
   Procedure Note  Patient: Carla Webb     HPI: [name]  74 year old female were seen for the first time.  She has been seen by Dr. Ayesha Webb in the past for bilateral knee osteoarthritis.  Right knee pain is worse.  She states that she has had right knee pain has been worse with weather changes.  She does report that the last injection on 09/11/2023 by Dr. Murrel Webb was somewhat beneficial.  She is working out at [employer]  She is trying to stay active.  Denies any fevers chills.  She is diabetic reports her last hemoglobin A1c at proximately 5.7.  And her lab work on epic 5 months ago her hemoglobin A1c was 6.0.  Review of systems: See HPI otherwise negative  Physical exam: General Well-developed well-nourished pleasant female in no acute distress. Psych: Alert and oriented x 3. Right knee: Good range of motion.  Tenderness medial joint line.  No abnormal warmth erythema or effusion.  Radiographs: Right knee: Knee is well located.  No acute fractures acute findings.  There are bone-on-bone medial compartment no significant change from prior x-rays.  Severe patellofemoral changes.  Lateral compartment overall well-preserved.        Date of Birth: 1950/03/18           MRN: [id number]             Visit Date: 06/01/2024  Procedures: Visit Diagnoses:  1. Primary osteoarthritis of right knee     Large Joint Inj: R knee on 06/01/2024 9:16 AM Details: 22 G 1.5 in needle, anterolateral approach  Arthrogram: No  Medications: 3 mL lidocaine  1 %; 40 mg methylPREDNISolone  acetate 40 MG/ML Outcome: tolerated well, no immediate complications Procedure, treatment alternatives, risks and benefits explained, specific risks discussed. Consent was given by the patient. Immediately prior to procedure a time out was called to verify the correct patient, procedure, equipment, support staff and site/side marked as required. Patient was prepped and draped in the usual sterile fashion.     Plan: She knows to  wait at least 3 months between cortisone injections.  She will follow-up as needed.  Questions were encouraged and answered at length.

## 2024-06-10 ENCOUNTER — Ambulatory Visit: Payer: Medicare PPO

## 2024-06-10 VITALS — Ht 60.0 in | Wt 150.0 lb

## 2024-06-10 DIAGNOSIS — Z0000 Encounter for general adult medical examination without abnormal findings: Secondary | ICD-10-CM | POA: Diagnosis not present

## 2024-06-10 DIAGNOSIS — Z780 Asymptomatic menopausal state: Secondary | ICD-10-CM | POA: Diagnosis not present

## 2024-06-10 DIAGNOSIS — Z1231 Encounter for screening mammogram for malignant neoplasm of breast: Secondary | ICD-10-CM

## 2024-06-10 NOTE — Patient Instructions (Signed)
 Ms. Carla Webb  , Thank you for taking time out of your busy schedule to complete your Annual Wellness Visit with me. I enjoyed our conversation and look forward to speaking with you again next year. I, as well as your care team,  appreciate your ongoing commitment to your health goals. Please review the following plan we discussed and let me know if I can assist you in the future. Your Game plan/ To Do List    Referrals Placed: Osteoporosis Screening: Please call the number below to schedule your appt. [facility] Phone: [phone]  Yearly Mammogram Cristine Done Please call the number below to schedule your appt. [facility] Phone: [phone]  Labs ordered for you: n/a  Follow up Visits: Next Medicare AWV with clinical staff:June 14, 2024 at 1:50pm  Have you seen your provider in the last 6 months (3 months if uncontrolled diabetes)? Yes Next Office Visit with your Primary Care Doctor: October 14, 2024 at 9:00 am in office with [facility] Clinician Recommendations:   Aim for 30 minutes of exercise or brisk walking, 6-8 glasses of water , and 5 servings of fruits and vegetables each day.  I enjoyed our conversation today and look forward to talking with you again next year!! Have a wonderful and safe year.  All the best, Breyton Vanscyoc  This is a list of the screening recommended for you and due dates:  Health Maintenance  Topic Date Due   DTaP/Tdap/Td vaccine (1 - Tdap) Never done   Zoster (Shingles) Vaccine (1 of 2) Never done   Pneumonia Vaccine (2 of 2 - PCV) 11/02/2015   DEXA scan (bone density measurement)  07/11/2023   COVID-19 Vaccine (8 - 2024-25 season) 11/11/2023   Eye exam for diabetics  12/28/2023   Mammogram  06/02/2024   Hemoglobin A1C  06/12/2024   Flu Shot  07/31/2024   Complete foot exam   08/21/2024   Yearly kidney function blood test for diabetes  12/12/2024   Yearly kidney health urinalysis for diabetes  04/13/2025    Medicare Annual Wellness Visit  06/10/2025   Cologuard (Stool DNA test)  07/26/2025   Hepatitis C Screening  Completed   HPV Vaccine  Aged Out   Meningitis B Vaccine  Aged Out    Advanced directives: (Declined) Advance directive discussed with you today. Even though you declined this today, please call our office should you change your mind, and we can give you the proper paperwork for you to fill out. Advance Care Planning is important because it:  [x]  Makes sure you receive the medical care that is consistent with your values, goals, and preferences  [x]  It provides guidance to your family and loved ones and reduces their decisional burden about whether or not they are making the right decisions based on your wishes.  Follow the link provided in your after visit summary or read over the paperwork we have mailed to you to help you started getting your Advance Directives in place. If you need assistance in completing these, please reach out to us  so that we can help you!  See attachments for Preventive Care and Fall Prevention Tips.  Understanding Your Risk for Falls Millions of people have serious injuries from falls each year. It is important to understand your risk of falling. Talk with your health care provider about your risk and what you can do to lower it. If you do have a serious fall, make sure to tell your  provider. Falling once raises your risk of falling again. How can falls affect me? Serious injuries from falls are common. These include: Broken bones, such as hip fractures. Head injuries, such as traumatic brain injuries (TBI) or concussions. A fear of falling can cause you to avoid activities and stay at home. This can make your muscles weaker and raise your risk for a fall. What can increase my risk? There are a number of risk factors that increase your risk for falling. The more risk factors you have, the higher your risk of falling. Serious injuries from a fall happen most  often to people who are older than 72 years old. Teenagers and young adults ages 38-29 are also at higher risk. Common risk factors include: Weakness in the lower body. Being generally weak or confused due to long-term (chronic) illness. Dizziness or balance problems. Poor vision. Medicines that cause dizziness or drowsiness. These may include: Medicines for your blood pressure, heart, anxiety, insomnia, or swelling (edema). Pain medicines. Muscle relaxants. Other risk factors include: Drinking alcohol . Having had a fall in the past. Having foot pain or wearing improper footwear. Working at a dangerous job. Having any of the following in your home: Tripping hazards, such as floor clutter or loose rugs. Poor lighting. Pets. Having dementia or memory loss. What actions can I take to lower my risk of falling?     Physical activity Stay physically fit. Do strength and balance exercises. Consider taking a regular class to build strength and balance. Yoga and tai chi are good options. Vision Have your eyes checked every year and your prescription for glasses or contacts updated as needed. Shoes and walking aids Wear non-skid shoes. Wear shoes that have rubber soles and low heels. Do not wear high heels. Do not walk around the house in socks or slippers. Use a cane or walker as told by your provider. Home safety Attach secure railings on both sides of your stairs. Install grab bars for your bathtub, shower, and toilet. Use a non-skid mat in your bathtub or shower. Attach bath mats securely with double-sided, non-slip rug tape. Use good lighting in all rooms. Keep a flashlight near your bed. Make sure there is a clear path from your bed to the bathroom. Use night-lights. Do not use throw rugs. Make sure all carpeting is taped or tacked down securely. Remove all clutter from walkways and stairways, including extension cords. Repair uneven or broken steps and floors. Avoid walking on  icy or slippery surfaces. Walk on the grass instead of on icy or slick sidewalks. Use ice melter to get rid of ice on walkways in the winter. Use a cordless phone. Questions to ask your health care provider Can you help me check my risk for a fall? Do any of my medicines make me more likely to fall? Should I take a vitamin D  supplement? What exercises can I do to improve my strength and balance? Should I make an appointment to have my vision checked? Do I need a bone density test to check for weak bones (osteoporosis)? Would it help to use a cane or a walker? Where to find more information Centers for Disease Control and Prevention, STEADI: TonerPromos.no Community-Based Fall Prevention Programs: TonerPromos.no [employer] on Aging: BaseRingTones.pl Contact a health care provider if: You fall at home. You are afraid of falling at home. You feel weak, drowsy, or dizzy. This information is not intended to replace advice given to you by your health care provider. Make sure you  discuss any questions you have with your health care provider. Document Revised: 08/20/2022 Document Reviewed: 08/20/2022 Elsevier Patient Education  2024 [employer].  Understanding Your Risk for Falls Millions of people have serious injuries from falls each year. It is important to understand your risk of falling. Talk with your health care provider about your risk and what you can do to lower it. If you do have a serious fall, make sure to tell your provider. Falling once raises your risk of falling again. How can falls affect me? Serious injuries from falls are common. These include: Broken bones, such as hip fractures. Head injuries, such as traumatic brain injuries (TBI) or concussions. A fear of falling can cause you to avoid activities and stay at home. This can make your muscles weaker and raise your risk for a fall. What can increase my risk? There are a number of risk factors that increase your risk for falling. The  more risk factors you have, the higher your risk of falling. Serious injuries from a fall happen most often to people who are older than 72 years old. Teenagers and young adults ages 37-29 are also at higher risk. Common risk factors include: Weakness in the lower body. Being generally weak or confused due to long-term (chronic) illness. Dizziness or balance problems. Poor vision. Medicines that cause dizziness or drowsiness. These may include: Medicines for your blood pressure, heart, anxiety, insomnia, or swelling (edema). Pain medicines. Muscle relaxants. Other risk factors include: Drinking alcohol . Having had a fall in the past. Having foot pain or wearing improper footwear. Working at a dangerous job. Having any of the following in your home: Tripping hazards, such as floor clutter or loose rugs. Poor lighting. Pets. Having dementia or memory loss. What actions can I take to lower my risk of falling?     Physical activity Stay physically fit. Do strength and balance exercises. Consider taking a regular class to build strength and balance. Yoga and tai chi are good options. Vision Have your eyes checked every year and your prescription for glasses or contacts updated as needed. Shoes and walking aids Wear non-skid shoes. Wear shoes that have rubber soles and low heels. Do not wear high heels. Do not walk around the house in socks or slippers. Use a cane or walker as told by your provider. Home safety Attach secure railings on both sides of your stairs. Install grab bars for your bathtub, shower, and toilet. Use a non-skid mat in your bathtub or shower. Attach bath mats securely with double-sided, non-slip rug tape. Use good lighting in all rooms. Keep a flashlight near your bed. Make sure there is a clear path from your bed to the bathroom. Use night-lights. Do not use throw rugs. Make sure all carpeting is taped or tacked down securely. Remove all clutter from walkways  and stairways, including extension cords. Repair uneven or broken steps and floors. Avoid walking on icy or slippery surfaces. Walk on the grass instead of on icy or slick sidewalks. Use ice melter to get rid of ice on walkways in the winter. Use a cordless phone. Questions to ask your health care provider Can you help me check my risk for a fall? Do any of my medicines make me more likely to fall? Should I take a vitamin D  supplement? What exercises can I do to improve my strength and balance? Should I make an appointment to have my vision checked? Do I need a bone density test to check for weak  bones (osteoporosis)? Would it help to use a cane or a walker? Where to find more information Centers for Disease Control and Prevention, STEADI: TonerPromos.no Community-Based Fall Prevention Programs: TonerPromos.no [employer] on Aging: BaseRingTones.pl Contact a health care provider if: You fall at home. You are afraid of falling at home. You feel weak, drowsy, or dizzy. This information is not intended to replace advice given to you by your health care provider. Make sure you discuss any questions you have with your health care provider. Document Revised: 08/20/2022 Document Reviewed: 08/20/2022 Elsevier Patient Education  2024 [employer].

## 2024-06-10 NOTE — Progress Notes (Signed)
 Mammogram and Dexa scans ordered. Last eye exam requested. Patient verbalized understanding and is in agreement with treatment plan.  Subjective:   Carla Webb  is a 74 y.o. who presents for a Medicare Wellness preventive visit.  As a reminder, Annual Wellness Visits don't include a physical exam, and some assessments may be limited, especially if this visit is performed virtually. We may recommend an in-person follow-up visit with your provider if needed.  Visit Complete: Virtual I connected with  Carla Webb  on 06/10/24 by a audio enabled telemedicine application and verified that I am speaking with the correct person using two identifiers.  Patient Location: Home  Provider Location: Home Office  I discussed the limitations of evaluation and management by telemedicine. The patient expressed understanding and agreed to proceed.  Vital Signs: Because this visit was a virtual/telehealth visit, some criteria may be missing or patient reported. Any vitals not documented were not able to be obtained and vitals that have been documented are patient reported.  VideoDeclined- This patient declined Librarian, academic. Therefore the visit was completed with audio only.  Persons Participating in Visit: Patient.  AWV Questionnaire: No: Patient Medicare AWV questionnaire was not completed prior to this visit.  Cardiac Risk Factors include: advanced age (>39men, >56 women);dyslipidemia;hypertension;smoking/ tobacco exposure;sedentary lifestyle     Objective:     Today's Vitals   06/10/24 1128  Weight: 150 lb (68 kg)  Height: 5' (1.524 m)   Body mass index is 29.29 kg/m.     05/30/2023    9:30 AM 08/03/2021    1:52 PM 09/09/2014    1:15 AM  Advanced Directives  Does Patient Have a Medical Advance Directive? No No No  Would patient like information on creating a medical advance directive? No - Patient declined No - Patient declined     Current  Medications (verified) Outpatient Encounter Medications as of 06/10/2024  Medication Sig   amLODipine (NORVASC) 5 MG tablet Take 5 mg by mouth daily.   aspirin 81 MG tablet Take 81 mg by mouth daily.   hydrochlorothiazide  (HYDRODIURIL ) 25 MG tablet Take 1 tablet (25 mg total) by mouth daily.   JARDIANCE  10 MG TABS tablet TAKE 1 TABLET BY MOUTH DAILY BEFORE BREAKFAST.   minoxidil  (ROGAINE ) 2 % external solution Apply topically 2 (two) times daily.   olmesartan  (BENICAR ) 20 MG tablet TAKE 1 TABLET BY MOUTH EVERY DAY   rosuvastatin  (CRESTOR ) 20 MG tablet Take 1 tablet (20 mg total) by mouth daily.   No facility-administered encounter medications on file as of 06/10/2024.    Allergies (verified) Patient has no known allergies.   History: Past Medical History:  Diagnosis Date   Hypertension    Past Surgical History:  Procedure Laterality Date   CATARACT EXTRACTION W/PHACO Right 07/24/2021   Procedure: CATARACT EXTRACTION PHACO AND INTRAOCULAR LENS PLACEMENT (IOC);  Surgeon: Tarri Farm, MD;  Location: [facility];  Service: Ophthalmology;  Laterality: Right;  CDE  8.98   CATARACT EXTRACTION W/PHACO Left 08/07/2021   Procedure: CATARACT EXTRACTION PHACO AND INTRAOCULAR LENS PLACEMENT LEFT EYE;  Surgeon: Tarri Farm, MD;  Location: [facility];  Service: Ophthalmology;  Laterality: Left;  CDE   8.32   History reviewed. No pertinent family history. Social History   Socioeconomic History   Marital status: Divorced    Spouse name: Not on file   Number of children: Not on file   Years of education: Not on file   Highest education level: Not on file  Occupational History   Not on file  Tobacco Use   Smoking status: Every Day    Current packs/day: 0.25    Average packs/day: 0.3 packs/day for 30.0 years (7.5 ttl pk-yrs)    Types: Cigarettes   Smokeless tobacco: Not on file   Tobacco comments:    Working on quitting, smoking about 2 cigarettes a day now.  Substance and Sexual Activity    Alcohol  use: No   Drug use: No   Sexual activity: Not on file  Other Topics Concern   Not on file  Social History Narrative   Not on file   Social Drivers of Health   Financial Resource Strain: Low Risk  (06/10/2024)   Overall Financial Resource Strain (CARDIA)    Difficulty of Paying Living Expenses: Not hard at all  Food Insecurity: No Food Insecurity (06/10/2024)   Hunger Vital Sign    Worried About Running Out of Food in the Last Year: Never true    Ran Out of Food in the Last Year: Never true  Transportation Needs: No Transportation Needs (06/10/2024)   PRAPARE - Administrator, Civil Service (Medical): No    Lack of Transportation (Non-Medical): No  Physical Activity: Sufficiently Active (06/10/2024)   Exercise Vital Sign    Days of Exercise per Week: 7 days    Minutes of Exercise per Session: 30 min  Stress: No Stress Concern Present (06/10/2024)   [employer] of Occupational Health - Occupational Stress Questionnaire    Feeling of Stress : Not at all  Social Connections: Moderately Isolated (06/10/2024)   Social Connection and Isolation Panel [NHANES]    Frequency of Communication with Friends and Family: More than three times a week    Frequency of Social Gatherings with Friends and Family: More than three times a week    Attends Religious Services: More than 4 times per year    Active Member of [employer] or Organizations: No    Attends Banker Meetings: Never    Marital Status: Divorced    Tobacco Counseling Ready to quit: Yes Counseling given: Yes Tobacco comments: Working on quitting, smoking about 2 cigarettes a day now.    Clinical Intake:  Pre-visit preparation completed: Yes  Pain : 0-10     BMI - recorded: 29.29 Nutritional Status: BMI 25 -29 Overweight Nutritional Risks: None Diabetes: Yes CBG done?: No (telehealth visit) Did pt. bring in CBG monitor from home?: No  Lab Results  Component Value Date   HGBA1C 6.0 (H)  12/13/2023   HGBA1C 6.6 (H) 04/09/2023     How often do you need to have someone help you when you read instructions, pamphlets, or other written materials from your doctor or pharmacy?: 1 - Never  Interpreter Needed?: No      Activities of Daily Living     06/10/2024   11:47 AM  In your present state of health, do you have any difficulty performing the following activities:  Hearing? 0  Vision? 0  Difficulty concentrating or making decisions? 0  Walking or climbing stairs? 0  Dressing or bathing? 0  Doing errands, shopping? 0  Preparing Food and eating ? N  Using the Toilet? N  In the past six months, have you accidently leaked urine? N  Do you have problems with loss of bowel control? N  Managing your Medications? N  Managing your Finances? N  Housekeeping or managing your Housekeeping? N    Patient Care Team: Tobi Fortes,  MD as PCP - General (Internal Medicine) Bronson Canny, PA-C as Physician Assistant (Orthopedic Surgery) Tarri Farm, MD as Consulting Physician (Ophthalmology) Augustin Bloch, OD (Optometry)  I have updated your Care Teams any recent Medical Services you may have received from other providers in the past year.     Assessment:    This is a routine wellness examination for Marquisa.  Hearing/Vision screen Hearing Screening - Comments:: Patient denies any hearing difficulties.   Vision Screening - Comments:: Patient wears reading glasses only. Up to date with yearly exams.  Patient sees Tarri Farm    Goals Addressed             This Visit's Progress    Patient Stated       I'd like to go to the [place].        Depression Screen     06/10/2024   12:14 PM 04/13/2024    9:34 AM 12/13/2023    8:52 AM 08/22/2023    8:36 AM 05/30/2023    9:31 AM 05/30/2023    9:30 AM 05/23/2023    8:45 AM  PHQ 2/9 Scores  PHQ - 2 Score 0 0 0 0 0 0 0  PHQ- 9 Score 0 1 0 0 0 0 0    Fall Risk     06/10/2024   11:49 AM 04/13/2024    9:35 AM  12/13/2023    8:51 AM 08/22/2023    8:36 AM 05/30/2023    9:30 AM  Fall Risk   Falls in the past year? 0 0 0 0 0  Number falls in past yr: 0 0 0 0 0  Injury with Fall? 0 0 0 0 0  Risk for fall due to : No Fall Risks No Fall Risks No Fall Risks No Fall Risks No Fall Risks  Follow up Falls evaluation completed Falls evaluation completed Falls evaluation completed Falls evaluation completed Falls evaluation completed    MEDICARE RISK AT HOME:  Medicare Risk at Home Any stairs in or around the home?: Yes If so, are there any without handrails?: No Home free of loose throw rugs in walkways, pet beds, electrical cords, etc?: Yes Adequate lighting in your home to reduce risk of falls?: Yes Life alert?: No Use of a cane, walker or w/c?: No  TIMED UP AND GO:  Was the test performed?  No  Cognitive Function: 6CIT completed    05/30/2023    9:31 AM  MMSE - Mini Mental State Exam  Not completed: Unable to complete        06/10/2024   11:49 AM 05/30/2023    9:34 AM  6CIT Screen  What Year? 0 points 0 points  What month? 0 points 0 points  What time? 0 points 0 points  Count back from 20 0 points 0 points  Months in reverse 0 points 2 points  Repeat phrase 0 points 0 points  Total Score 0 points 2 points    Immunizations Immunization History  Administered Date(s) Administered   Fluad Quad(high Dose 65+) 09/16/2023   Moderna SARS-COV2 Booster Vaccination 10/23/2022, 09/16/2023   Moderna Sars-Covid-2 Vaccination 02/11/2020, 03/10/2020, 11/29/2020, 06/02/2021, 10/10/2021   Pneumococcal Polysaccharide-23 11/01/2014   Rsv, Bivalent, Protein Subunit Rsvpref,pf Pattricia Bores) 09/19/2022    Screening Tests Health Maintenance  Topic Date Due   DTaP/Tdap/Td (1 - Tdap) Never done   Zoster Vaccines- Shingrix (1 of 2) Never done   Pneumonia Vaccine 80+ Years old (2 of 2 - PCV) 11/02/2015  DEXA SCAN  07/11/2023   COVID-19 Vaccine (8 - 2024-25 season) 11/11/2023   OPHTHALMOLOGY EXAM   12/28/2023   MAMMOGRAM  06/02/2024   HEMOGLOBIN A1C  06/12/2024   INFLUENZA VACCINE  07/31/2024   FOOT EXAM  08/21/2024   Diabetic kidney evaluation - eGFR measurement  12/12/2024   Diabetic kidney evaluation - Urine ACR  04/13/2025   Medicare Annual Wellness (AWV)  06/10/2025   Fecal DNA (Cologuard)  07/26/2025   Hepatitis C Screening  Completed   HPV VACCINES  Aged Out   Meningococcal B Vaccine  Aged Out    Health Maintenance  Health Maintenance Due  Topic Date Due   DTaP/Tdap/Td (1 - Tdap) Never done   Zoster Vaccines- Shingrix (1 of 2) Never done   Pneumonia Vaccine 12+ Years old (2 of 2 - PCV) 11/02/2015   DEXA SCAN  07/11/2023   COVID-19 Vaccine (8 - 2024-25 season) 11/11/2023   OPHTHALMOLOGY EXAM  12/28/2023   MAMMOGRAM  06/02/2024   Health Maintenance Items Addressed: Mammogram ordered, DEXA ordered, Last eye exam requested  Additional Screening:  Vision Screening: Recommended annual ophthalmology exams for early detection of glaucoma and other disorders of the eye. Would you like a referral to an eye doctor? No    Dental Screening: Recommended annual dental exams for proper oral hygiene  Community Resource Referral / Chronic Care Management: CRR required this visit?  No   CCM required this visit?  No   Plan: Mammogram and Dexa scans ordered. Last eye exam requested. Patient verbalized understanding and is in agreement with treatment plan.     I have personally reviewed and noted the following in the patient's chart:   Medical and social history Use of alcohol , tobacco or illicit drugs  Current medications and supplements including opioid prescriptions. Patient is not currently taking opioid prescriptions. Functional ability and status Nutritional status Physical activity Advanced directives List of other physicians Hospitalizations, surgeries, and ER visits in previous 12 months Vitals Screenings to include cognitive, depression, and falls Referrals  and appointments  In addition, I have reviewed and discussed with patient certain preventive protocols, quality metrics, and best practice recommendations. A written personalized care plan for preventive services as well as general preventive health recommendations were provided to patient.   Ameya Kutz, CMA   06/10/2024   After Visit Summary: (Declined) Due to this being a telephonic visit, with patients personalized plan was offered to patient but patient Declined AVS at this time   Notes: Mammogram and Dexa scans ordered. Last eye exam requested. Patient verbalized understanding and is in agreement with treatment plan.

## 2024-06-19 ENCOUNTER — Other Ambulatory Visit: Payer: Self-pay | Admitting: Internal Medicine

## 2024-06-19 DIAGNOSIS — E1169 Type 2 diabetes mellitus with other specified complication: Secondary | ICD-10-CM

## 2024-07-09 DIAGNOSIS — E119 Type 2 diabetes mellitus without complications: Secondary | ICD-10-CM | POA: Diagnosis not present

## 2024-07-09 DIAGNOSIS — H01005 Unspecified blepharitis left lower eyelid: Secondary | ICD-10-CM | POA: Diagnosis not present

## 2024-07-09 DIAGNOSIS — H01004 Unspecified blepharitis left upper eyelid: Secondary | ICD-10-CM | POA: Diagnosis not present

## 2024-07-09 DIAGNOSIS — H524 Presbyopia: Secondary | ICD-10-CM | POA: Diagnosis not present

## 2024-07-09 DIAGNOSIS — H01001 Unspecified blepharitis right upper eyelid: Secondary | ICD-10-CM | POA: Diagnosis not present

## 2024-07-09 DIAGNOSIS — H4323 Crystalline deposits in vitreous body, bilateral: Secondary | ICD-10-CM | POA: Diagnosis not present

## 2024-07-09 DIAGNOSIS — Z961 Presence of intraocular lens: Secondary | ICD-10-CM | POA: Diagnosis not present

## 2024-07-09 DIAGNOSIS — H01002 Unspecified blepharitis right lower eyelid: Secondary | ICD-10-CM | POA: Diagnosis not present

## 2024-07-24 ENCOUNTER — Telehealth: Payer: Self-pay

## 2024-07-24 NOTE — Telephone Encounter (Signed)
 Copied from CRM [id number]. Topic: Appointments - Scheduling Inquiry for Clinic >> Jul 24, 2024 12:37 PM Rosaria E wrote: Reason for CRM: Pt needs A1c results for her insurance, wants to come In but needs sooner than what is available.   Best contact: [phone]   ----------------------------------------------------------------------- From previous Reason for Contact - Scheduling: Patient/patient representative is calling to schedule an appointment. Refer to attachments for appointment information.

## 2024-07-27 ENCOUNTER — Other Ambulatory Visit: Payer: Self-pay

## 2024-07-27 DIAGNOSIS — N1831 Chronic kidney disease, stage 3a: Secondary | ICD-10-CM

## 2024-07-27 NOTE — Telephone Encounter (Signed)
 Called pt and made her aware of the lab orders

## 2024-08-03 DIAGNOSIS — N1831 Chronic kidney disease, stage 3a: Secondary | ICD-10-CM | POA: Diagnosis not present

## 2024-08-03 DIAGNOSIS — E1122 Type 2 diabetes mellitus with diabetic chronic kidney disease: Secondary | ICD-10-CM | POA: Diagnosis not present

## 2024-08-04 LAB — BASIC METABOLIC PANEL WITH GFR
BUN/Creatinine Ratio: 15 (ref 12–28)
BUN: 19 mg/dL (ref 8–27)
CO2: 23 mmol/L (ref 20–29)
Calcium: 9.7 mg/dL (ref 8.7–10.3)
Chloride: 107 mmol/L — ABNORMAL HIGH (ref 96–106)
Creatinine, Ser: 1.28 mg/dL — ABNORMAL HIGH (ref 0.57–1.00)
Glucose: 90 mg/dL (ref 70–99)
Potassium: 4.1 mmol/L (ref 3.5–5.2)
Sodium: 144 mmol/L (ref 134–144)
eGFR: 45 mL/min/1.73 — ABNORMAL LOW (ref >59)

## 2024-08-04 LAB — HEMOGLOBIN A1C
Est. average glucose Bld gHb Est-mCnc: 120 mg/dL
Hgb A1c MFr Bld: 5.8 % — ABNORMAL HIGH (ref 4.8–5.6)

## 2024-09-16 ENCOUNTER — Other Ambulatory Visit: Payer: Self-pay | Admitting: Family Medicine

## 2024-09-16 ENCOUNTER — Telehealth: Payer: Self-pay

## 2024-09-16 NOTE — Telephone Encounter (Signed)
 Sent to pharmacy

## 2024-09-16 NOTE — Telephone Encounter (Signed)
 Patient out of her blood pressure medicine, last given by Hilario Hocking  Prescription Request  09/16/2024  LOV: Visit date not found  What is the name of the medication or equipment? amLODipine (NORVASC) 5 MG tablet   Have you contacted your pharmacy to request a refill? Yes   Which pharmacy would you like this sent to?  [id number] [address], [place] - [address] [place] [address] Phone: [phone] Fax: [phone]    Patient notified that their request is being sent to the clinical staff for review and that they should receive a response within 2 business days.   Please advise at walked into office

## 2024-09-23 ENCOUNTER — Ambulatory Visit: Payer: Self-pay

## 2024-10-14 ENCOUNTER — Ambulatory Visit

## 2024-10-14 VITALS — BP 145/79 | HR 79 | Ht 62.0 in | Wt 148.1 lb

## 2024-10-14 DIAGNOSIS — N1831 Chronic kidney disease, stage 3a: Secondary | ICD-10-CM

## 2024-10-14 DIAGNOSIS — E1169 Type 2 diabetes mellitus with other specified complication: Secondary | ICD-10-CM | POA: Diagnosis not present

## 2024-10-14 DIAGNOSIS — Z7984 Long term (current) use of oral hypoglycemic drugs: Secondary | ICD-10-CM

## 2024-10-14 DIAGNOSIS — I10 Essential (primary) hypertension: Secondary | ICD-10-CM | POA: Diagnosis not present

## 2024-10-14 DIAGNOSIS — E1122 Type 2 diabetes mellitus with diabetic chronic kidney disease: Secondary | ICD-10-CM

## 2024-10-14 DIAGNOSIS — E785 Hyperlipidemia, unspecified: Secondary | ICD-10-CM | POA: Diagnosis not present

## 2024-10-14 MED ORDER — OLMESARTAN MEDOXOMIL 20 MG PO TABS: 20.0000 mg | ORAL_TABLET | Freq: Every day | ORAL | 2 refills | Status: AC

## 2024-10-14 MED ORDER — EMPAGLIFLOZIN 10 MG PO TABS: 10.0000 mg | ORAL_TABLET | Freq: Every day | ORAL | 5 refills | Status: AC

## 2024-10-14 NOTE — Progress Notes (Signed)
 Established Patient Office Visit  Subjective   Patient ID: Carla Webb , female    DOB: April 04, 1950  Age: 74 y.o. MRN: [id number]  Chief Complaint  Patient presents with   Medical Management of Chronic Issues    6 month follow up     HPI Discussed the use of AI scribe software for clinical note transcription with the patient, who gave verbal consent to proceed.  History of Present Illness   Carla Hector  Webb is a 74 year old female who presents for a six-month follow-up visit.  Medication adherence and tolerance - Takes prescribed medications regularly, though not always at the same time each day due to a busy schedule - Ensures to take medications as soon as she remembers if a dose is missed - Takes Jardiance  a couple of times a week instead of daily due to feeling unwell with daily use  Renal function and dietary management - History of decreased kidney function - Manages renal health by monitoring sodium intake and maintaining adequate hydration  Lifestyle and functional status - Maintains an active lifestyle, attending the gym three times per week - Prepares her own meals, performs house cleaning, and manages laundry with assistance from her youngest son who lives with her  Dietary habits - Primarily drinks water  - Avoids soft drinks - Occasionally consumes unsweetened tea and ginger ale in mini cans      Patient Active Problem List   Diagnosis Date Noted   Alopecia of scalp 03/05/2024   Breast cancer screening by mammogram 05/23/2023   CKD (chronic kidney disease) stage 3, GFR 30-59 ml/min (HCC) 05/01/2023   Hyperlipidemia associated with type 2 diabetes mellitus (HCC) 05/01/2023   Current tobacco use 04/15/2023   Type 2 diabetes mellitus (HCC) 04/15/2023   Encounter for general adult medical examination with abnormal findings 04/15/2023   Osteoarthritis of right knee 10/09/2007   Essential hypertension 10/07/2007    ROS    Objective:     BP  (!) 145/79   Pulse 79   Ht 5' 2 (1.575 m)   Wt 148 lb 1.9 oz (67.2 kg)   SpO2 96%   BMI 27.09 kg/m  BP Readings from Last 3 Encounters:  10/14/24 (!) 145/79  04/13/24 118/62  03/05/24 130/80   Wt Readings from Last 3 Encounters:  10/14/24 148 lb 1.9 oz (67.2 kg)  06/10/24 150 lb (68 kg)  04/13/24 150 lb 9.6 oz (68.3 kg)      Physical Exam Vitals and nursing note reviewed.  Constitutional:      Appearance: Normal appearance.  HENT:     Head: Normocephalic.  Eyes:     Extraocular Movements: Extraocular movements intact.     Pupils: Pupils are equal, round, and reactive to light.  Cardiovascular:     Rate and Rhythm: Normal rate and regular rhythm.  Pulmonary:     Effort: Pulmonary effort is normal.     Breath sounds: Normal breath sounds.  Musculoskeletal:     Cervical back: Normal range of motion and neck supple.  Neurological:     Mental Status: She is alert and oriented to person, place, and time.  Psychiatric:        Mood and Affect: Mood normal.        Thought Content: Thought content normal.      No results found for any visits on 10/14/24.  Last metabolic panel Lab Results  Component Value Date   GLUCOSE 90 08/03/2024   NA 144 08/03/2024  K 4.1 08/03/2024   CL 107 (H) 08/03/2024   CO2 23 08/03/2024   BUN 19 08/03/2024   CREATININE 1.28 (H) 08/03/2024   EGFR 45 (L) 08/03/2024   CALCIUM  9.7 08/03/2024   PROT 7.9 12/13/2023   ALBUMIN 4.2 12/13/2023   LABGLOB 3.7 12/13/2023   AGRATIO 1.2 05/23/2023   BILITOT 0.4 12/13/2023   ALKPHOS 79 12/13/2023   AST 18 12/13/2023   ALT 13 12/13/2023   ANIONGAP 13 09/09/2014   Last hemoglobin A1c Lab Results  Component Value Date   HGBA1C 5.8 (H) 08/03/2024      The 10-year ASCVD risk score (Arnett DK, et al., 2019) is: 42.6%    Assessment & Plan:   Problem List Items Addressed This Visit       Cardiovascular and Mediastinum   Essential hypertension - Primary   Remains adequately controlled on  current antihypertensive regimen.  No medication changes are indicated today.      Relevant Medications   olmesartan  (BENICAR ) 20 MG tablet     Endocrine   Type 2 diabetes mellitus (HCC)   She is currently prescribed Jardiance  10 mg daily.  No medication changes are indicated today.        Relevant Medications   empagliflozin  (JARDIANCE ) 10 MG TABS tablet   olmesartan  (BENICAR ) 20 MG tablet   Hyperlipidemia associated with type 2 diabetes mellitus (HCC)   She is currently prescribed rosuvastatin  20 mg daily.  Update lipid panel today.        Relevant Medications   empagliflozin  (JARDIANCE ) 10 MG TABS tablet   olmesartan  (BENICAR ) 20 MG tablet     Genitourinary   CKD (chronic kidney disease) stage 3, GFR 30-59 ml/min (HCC)   Renal function stable on labs from December.  Currently on olmesartan  and Jardiance .  No changes are indicated today.      Relevant Medications   empagliflozin  (JARDIANCE ) 10 MG TABS tablet    Return in about 6 months (around 04/14/2025) for chronic follow-up with PCP.    Leita Longs, FNP

## 2024-10-15 DIAGNOSIS — E785 Hyperlipidemia, unspecified: Secondary | ICD-10-CM | POA: Diagnosis not present

## 2024-10-15 DIAGNOSIS — Z7984 Long term (current) use of oral hypoglycemic drugs: Secondary | ICD-10-CM | POA: Diagnosis not present

## 2024-10-15 DIAGNOSIS — M1990 Unspecified osteoarthritis, unspecified site: Secondary | ICD-10-CM | POA: Diagnosis not present

## 2024-10-15 DIAGNOSIS — N1831 Chronic kidney disease, stage 3a: Secondary | ICD-10-CM | POA: Diagnosis not present

## 2024-10-15 DIAGNOSIS — I129 Hypertensive chronic kidney disease with stage 1 through stage 4 chronic kidney disease, or unspecified chronic kidney disease: Secondary | ICD-10-CM | POA: Diagnosis not present

## 2024-10-15 DIAGNOSIS — Z8249 Family history of ischemic heart disease and other diseases of the circulatory system: Secondary | ICD-10-CM | POA: Diagnosis not present

## 2024-10-15 DIAGNOSIS — Z833 Family history of diabetes mellitus: Secondary | ICD-10-CM | POA: Diagnosis not present

## 2024-10-15 DIAGNOSIS — E1122 Type 2 diabetes mellitus with diabetic chronic kidney disease: Secondary | ICD-10-CM | POA: Diagnosis not present

## 2024-10-15 DIAGNOSIS — Z809 Family history of malignant neoplasm, unspecified: Secondary | ICD-10-CM | POA: Diagnosis not present

## 2024-10-18 NOTE — Assessment & Plan Note (Signed)
 Renal function stable on labs from December.  Currently on olmesartan and Jardiance.  No changes are indicated today.

## 2024-10-18 NOTE — Assessment & Plan Note (Signed)
 She is currently prescribed rosuvastatin  20 mg daily.  Update lipid panel today.

## 2024-10-18 NOTE — Assessment & Plan Note (Signed)
 Remains adequately controlled on current antihypertensive regimen.  No medication changes are indicated today.

## 2024-10-18 NOTE — Assessment & Plan Note (Signed)
 She is currently prescribed Jardiance  10 mg daily.  No medication changes are indicated today.

## 2025-04-14 ENCOUNTER — Ambulatory Visit

## 2025-06-14 ENCOUNTER — Ambulatory Visit
